# Patient Record
Sex: Female | Born: 2000 | Race: White | Hispanic: No | Marital: Single | Smoking: Never smoker
Health system: Southern US, Community
[De-identification: ages and names within clinical notes are randomized; demographics above are authoritative.]

## PROBLEM LIST (undated history)

## (undated) ENCOUNTER — Inpatient Hospital Stay (HOSPITAL_COMMUNITY): Payer: Self-pay

## (undated) DIAGNOSIS — Z8759 Personal history of other complications of pregnancy, childbirth and the puerperium: Secondary | ICD-10-CM

## (undated) DIAGNOSIS — O24419 Gestational diabetes mellitus in pregnancy, unspecified control: Secondary | ICD-10-CM

## (undated) DIAGNOSIS — Z789 Other specified health status: Secondary | ICD-10-CM

## (undated) HISTORY — PX: CHOLECYSTECTOMY: SHX55

## (undated) HISTORY — DX: Personal history of other complications of pregnancy, childbirth and the puerperium: Z87.59

---
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
## 2018-04-01 ENCOUNTER — Inpatient Hospital Stay (HOSPITAL_COMMUNITY)
Admission: AD | Admit: 2018-04-01 | Discharge: 2018-04-01 | Disposition: A | Discharge status: Home / Self Care | Payer: Medicaid Other | Attending: Obstetrics and Gynecology | Admitting: Obstetrics and Gynecology

## 2018-04-01 ENCOUNTER — Encounter (HOSPITAL_COMMUNITY): Payer: Self-pay | Admitting: *Deleted

## 2018-04-01 DIAGNOSIS — O26892 Other specified pregnancy related conditions, second trimester: Secondary | ICD-10-CM | POA: Diagnosis not present

## 2018-04-01 DIAGNOSIS — Z3A20 20 weeks gestation of pregnancy: Secondary | ICD-10-CM | POA: Diagnosis not present

## 2018-04-01 DIAGNOSIS — W19XXXA Unspecified fall, initial encounter: Secondary | ICD-10-CM | POA: Diagnosis not present

## 2018-04-01 DIAGNOSIS — W182XXA Fall in (into) shower or empty bathtub, initial encounter: Secondary | ICD-10-CM | POA: Insufficient documentation

## 2018-04-01 DIAGNOSIS — M25521 Pain in right elbow: Secondary | ICD-10-CM | POA: Diagnosis present

## 2018-04-01 DIAGNOSIS — Z3492 Encounter for supervision of normal pregnancy, unspecified, second trimester: Secondary | ICD-10-CM

## 2018-04-01 LAB — HEMOGLOBIN AND HEMATOCRIT, BLOOD
HEMATOCRIT: 38 % (ref 36.0–46.0)
HEMOGLOBIN: 13 g/dL (ref 12.0–15.0)

## 2018-04-01 LAB — URINALYSIS, ROUTINE W REFLEX MICROSCOPIC
Bilirubin Urine: NEGATIVE
Glucose, UA: NEGATIVE mg/dL
Hgb urine dipstick: NEGATIVE
KETONES UR: NEGATIVE mg/dL
LEUKOCYTES UA: NEGATIVE
Nitrite: NEGATIVE
PROTEIN: NEGATIVE mg/dL
Specific Gravity, Urine: >1.03 — ABNORMAL HIGH (ref 1.005–1.030)
pH: 5.5 (ref 5.0–8.0)

## 2018-04-01 MED ORDER — ACETAMINOPHEN 500 MG PO TABS
1000.0000 mg | ORAL_TABLET | Freq: Once | ORAL | Status: AC
  Administered 2018-04-01: 1000 mg via ORAL
  Filled 2018-04-01: qty 2

## 2018-04-01 NOTE — MAU Note (Signed)
Urine in lab 

## 2018-04-01 NOTE — MAU Provider Note (Signed)
History     CSN: [id number]  Arrival date and time: 04/01/18 1646   First Provider Initiated Contact with Patient 04/01/18 1717      Chief Complaint  Patient presents with  . right elbow pain   HPI Julia Nelson is a 17 y.o. G1P0 at [redacted]w[redacted]d who presents to MAU for evaluation after she fell in bathroom today at 1530pm. Patient states she has intermittent numbness in her right hand and foot which predates her fall. Today  "my leg gave out", causing her to fall out of the shower and onto her back, also bumping the back of her head. She denies abdominal trauma, leaking fluid, vaginal bleeding. She states her only complaint is her scraped right knee and her headache, which she rates as 3/10, located in right parietal, does not radiate, no aggravating or alleviating factors. She has not taken medication, iced her painful sites or tried other treatments.  OB History    Gravida  1   Para      Term      Preterm      AB      Living        SAB      TAB      Ectopic      Multiple      Live Births              Past Medical History:  Diagnosis Date  . Medical history non-contributory     Past Surgical History:  Procedure Laterality Date  . CHOLECYSTECTOMY  2016  . NO PAST SURGERIES      History reviewed. No pertinent family history.  Social History   Tobacco Use  . Smoking status: Never Smoker  . Smokeless tobacco: Never Used  Substance Use Topics  . Alcohol use: No    Frequency: Never  . Drug use: No    Allergies: No Known Allergies  Medications Prior to Admission  Medication Sig Dispense Refill Last Dose  . prenatal vitamin w/FE, FA (NATACHEW) 29-1 MG CHEW chewable tablet Chew 1 tablet by mouth daily at 12 noon.   03/31/2018 at Unknown time  . promethazine (PHENERGAN) 25 MG tablet Take 1 tablet (25 mg total) by mouth every 6 (six) hours as needed for nausea or vomiting. 30 tablet 0 03/31/2018 at Unknown time  . benzonatate (TESSALON) 100 MG capsule  Take 1 capsule (100 mg total) by mouth every 8 (eight) hours. 21 capsule 0 Unknown at Unknown time  . butalbital-acetaminophen-caffeine (FIORICET, ESGIC) 50-325-40 MG tablet Take 1-2 tablets by mouth every 6 (six) hours as needed for headache. 20 tablet 0 Unknown at Unknown time  . cyclobenzaprine (FLEXERIL) 5 MG tablet Take 1 tablet (5 mg total) by mouth 3 (three) times daily as needed for muscle spasms. 12 tablet 0 Unknown at Unknown time  . fluticasone (FLONASE) 50 MCG/ACT nasal spray Place 2 sprays into both nostrils daily. 1 g 0 Unknown at Unknown time    Review of Systems  Constitutional: Negative for chills, fatigue and fever.  Gastrointestinal: Negative for abdominal pain.  Genitourinary: Negative for vaginal bleeding, vaginal discharge and vaginal pain.  Musculoskeletal: Negative for back pain, gait problem, joint swelling and myalgias.  Skin: Positive for wound.       Abrasion to right knee  Neurological: Positive for headaches. Negative for dizziness, tremors, seizures, syncope, facial asymmetry, speech difficulty, weakness and light-headedness.   Physical Exam   Blood pressure 126/75, pulse (!) 106, temperature 98.6 F (37  C), temperature source Oral, resp. rate 16, height 5' 6.5" (1.689 m), weight 98 kg, last menstrual period 11/07/2017, SpO2 100 %.  Physical Exam  Nursing note and vitals reviewed. Constitutional: She is oriented to person, place, and time. She appears well-developed and well-nourished.  Cardiovascular: Normal rate, normal heart sounds and intact distal pulses.  Respiratory: Effort normal and breath sounds normal.  GI: Soft. She exhibits no distension. There is no abdominal tenderness. There is no rebound and no guarding.  Neurological: She is alert and oriented to person, place, and time. She has normal strength and normal reflexes. She displays no tremor and normal reflexes. No cranial nerve deficit or sensory deficit. She exhibits normal muscle tone.  Coordination and gait normal.  Reflex Scores:      Brachioradialis reflexes are 2+ on the right side and 2+ on the left side.      Achilles reflexes are 2+ on the right side and 2+ on the left side. Skin: Skin is warm and dry.  Supervision 1.5cm abrasion to right knee  Psychiatric: She has a normal mood and affect. Her behavior is normal. Judgment and thought content normal.     MAU Course/MDM   --Intact neuro exam, no evidence of head trauma --Headache resolved with Tylenol administered in MAU --No external injury from fall other than scrape to right knee. No bruising or swelling noted anywhere --No tenderness induced during scalp exam. No swelling or lump palpated --Reviewed concussion signs and symptoms. Patient lives with her FOB and he will be home to monitor her   Patient Vitals for the past 24 hrs:  BP Temp Temp src Pulse Resp SpO2 Height Weight  04/01/18 1934 128/71 - - - - - - -  04/01/18 1658 126/75 98.6 F (37 C) Oral (!) 106 16 100 % 5' 6.5" (1.689 m) 98 kg    Results for orders placed or performed during the hospital encounter of 04/01/18 (from the past 24 hour(s))  Urinalysis, Routine w reflex microscopic     Status: Abnormal   Collection Time: 04/01/18  5:07 PM  Result Value Ref Range   Color, Urine YELLOW YELLOW   APPearance HAZY (A) CLEAR   Specific Gravity, Urine >1.030 (H) 1.005 - 1.030   pH 5.5 5.0 - 8.0   Glucose, UA NEGATIVE NEGATIVE mg/dL   Hgb urine dipstick NEGATIVE NEGATIVE   Bilirubin Urine NEGATIVE NEGATIVE   Ketones, ur NEGATIVE NEGATIVE mg/dL   Protein, ur NEGATIVE NEGATIVE mg/dL   Nitrite NEGATIVE NEGATIVE   Leukocytes, UA NEGATIVE NEGATIVE  Hemoglobin and hematocrit, blood     Status: None   Collection Time: 04/01/18  6:52 PM  Result Value Ref Range   Hemoglobin 13.0 12.0 - 15.0 g/dL   HCT 16.138.0 09.636.0 - 04.546.0 %    Assessment and Plan  --17 y.o. G1P0 at 7013w5d  --Initial encounter, fall without abdominal trauma --FHT 162 by doppler --Return  to closest ED for new onset signs/symptoms associated with increasing acuity (see AVS) --Discharge home in stable condition  F/U: Patient has OB appt mid-January  Calvert CantorSamantha C Brittin Belnap, CNM 04/01/2018, 7:52 PM

## 2018-04-01 NOTE — Discharge Instructions (Signed)
Concussion, Adult  A concussion is a brain injury from a direct hit (blow) to the head or body. This injury causes the brain to shake quickly back and forth inside the skull. It is caused by:   A hit to the head.   A quick and sudden movement (jolt) of the head or neck.    How fast you will get better from a concussion depends on many things like how bad your concussion was, what part of your brain was hurt, how old you are, and how healthy you were before the concussion. Recovery can take time. It is important to wait to return to activity until a doctor says it is safe and your symptoms are all gone.  Follow these instructions at home:  Activity   Limit activities that need a lot of thought or concentration. These include:  ? Homework or work for your job.  ? Watching TV.  ? Computer work.  ? Playing memory games and puzzles.   Rest. Rest helps the brain to heal. Make sure you:  ? Get plenty of sleep at night. Do not stay up late.  ? Go to bed at the same time every day.  ? Rest during the day. Take naps or rest breaks when you feel tired.   It can be dangerous if you get another concussion before the first one has healed Do not do activities that could cause a second concussion, such as riding a bike or playing sports.   Ask your doctor when you can return to your normal activities, like driving, riding a bike, or using machinery. Your ability to react may be slower. Do not do these activities if you are dizzy. Your doctor will likely give you a plan for slowly going back to activities.  General instructions   Take over-the-counter and prescription medicines only as told by your doctor.   Do not drink alcohol until your doctor says you can.   If it is harder than usual to remember things, write them down.   If you are easily distracted, try to do one thing at a time. For example, do not try to watch TV while making dinner.   Talk with family members or close friends when you need to make important  decisions.   Watch your symptoms and tell other people to do the same. Other problems (complications) can happen after a concussion. Older adults with a brain injury may have a higher risk of serious problems, such as a blood clot in the brain.   Tell your teachers, school nurse, school counselor, coach, athletic trainer, or work manager about your injury and symptoms. Tell them about what you can or cannot do. They should watch for:  ? More problems with attention or concentration.  ? More trouble remembering or learning new information.  ? More time needed to do tasks or assignments.  ? Being more annoyed (irritable) or having a harder time dealing with stress.  ? Any other symptoms that get worse.   Keep all follow-up visits as told by your health care provider. This is important.  Prevention   It is very important that you donot get another brain injury, especially before you have healed. In rare cases, another injury can cause permanent brain damage, brain swelling, or death. You have the most risk if you get another head injury in the first 7-10 days after you were hurt before. To avoid injuries:  ? Wear a seat belt when you ride in   a car.  ? Do not drink too much alcohol.  ? Avoid activities that could make you get a second concussion, like contact sports.  ? Wear a helmet when you do activities like:   Biking.   Skiing.   Skateboarding.   Skating.  ? Make your home safe by:   Removing things from the floor or stairs that could make you trip.   Using grab bars in bathrooms and handrails by stairs.   Placing non-slip mats on floors and in bathtubs.   Putting more light in dark areas.  Contact a doctor if:   Your symptoms get worse.   You have new symptoms.   You keep having symptoms for more than 2 weeks.  Get help right away if:   You have bad headaches, or your headaches get worse.   You have weakness in any part of your body.   You have loss of feeling (numbness).   You feel off  balance.   You keep throwing up (vomiting).   You feel more sleepy.   The black center of one eye (pupil) is bigger than the other one.   You twitch or shake violently (convulse) or have a seizure.   Your speech is not clear (is slurred).   You feel more tired, more confused, or more annoyed.   You do not recognize people or places.   You have neck pain.   It is hard to wake you up.   You have strange behavior changes.   You pass out (lose consciousness).  Summary   A concussion is a brain injury from a direct hit (blow) to the head or body.   This condition is treated with rest and careful watching of symptoms.   If you keep having symptoms for more than 2 weeks, call your doctor.  This information is not intended to replace advice given to you by your health care provider. Make sure you discuss any questions you have with your health care provider.  Document Released: 03/22/2009 Document Revised: 03/18/2016 Document Reviewed: 03/18/2016  [employer]vier Interactive Patient Education  2017 [employer]

## 2018-04-01 NOTE — MAU Note (Signed)
Pt reports that her right leg has been numb off/on since this am. Reports when she was in the shower her leg "gave out" and she fell . This happened at 1530, denies bleeding. Reports she landed on her back and right arm and her head hit the floor.

## 2018-04-17 DIAGNOSIS — I10 Essential (primary) hypertension: Secondary | ICD-10-CM

## 2018-04-17 HISTORY — DX: Essential (primary) hypertension: I10

## 2018-04-17 NOTE — L&D Delivery Note (Signed)
Pt was admitted for induction yesterday. She received 3 cytotecs then had pit aug. She had an amniotomy with clear fluid. She completed the first stage without difficulty. She had an epidural. She pushed for one hour.She had a SVD of one live viable female infant over an intact perineum in the ROA position. Placenta S/I. EBL-450cc/ Baby to NBN. Bilateral vaginal wall tears closed with 3-0 chromic.

## 2018-06-23 ENCOUNTER — Inpatient Hospital Stay (HOSPITAL_COMMUNITY)
Admission: AD | Admit: 2018-06-23 | Discharge: 2018-06-23 | Disposition: A | Discharge status: Home / Self Care | Payer: Medicaid Other | Attending: Obstetrics and Gynecology | Admitting: Obstetrics and Gynecology

## 2018-06-23 ENCOUNTER — Other Ambulatory Visit: Payer: Self-pay

## 2018-06-23 ENCOUNTER — Encounter (HOSPITAL_COMMUNITY): Payer: Self-pay

## 2018-06-23 DIAGNOSIS — Z0371 Encounter for suspected problem with amniotic cavity and membrane ruled out: Secondary | ICD-10-CM | POA: Diagnosis present

## 2018-06-23 DIAGNOSIS — Z9049 Acquired absence of other specified parts of digestive tract: Secondary | ICD-10-CM | POA: Diagnosis not present

## 2018-06-23 DIAGNOSIS — Z833 Family history of diabetes mellitus: Secondary | ICD-10-CM | POA: Insufficient documentation

## 2018-06-23 DIAGNOSIS — O4703 False labor before 37 completed weeks of gestation, third trimester: Secondary | ICD-10-CM

## 2018-06-23 DIAGNOSIS — O479 False labor, unspecified: Secondary | ICD-10-CM | POA: Diagnosis present

## 2018-06-23 DIAGNOSIS — Z3A32 32 weeks gestation of pregnancy: Secondary | ICD-10-CM | POA: Diagnosis not present

## 2018-06-23 DIAGNOSIS — O4700 False labor before 37 completed weeks of gestation, unspecified trimester: Secondary | ICD-10-CM | POA: Diagnosis present

## 2018-06-23 LAB — WET PREP, GENITAL
CLUE CELLS WET PREP: NONE SEEN
Sperm: NONE SEEN
TRICH WET PREP: NONE SEEN
YEAST WET PREP: NONE SEEN

## 2018-06-23 LAB — URINALYSIS, ROUTINE W REFLEX MICROSCOPIC
Bilirubin Urine: NEGATIVE
Glucose, UA: NEGATIVE mg/dL
Hgb urine dipstick: NEGATIVE
KETONES UR: 20 mg/dL — AB
LEUKOCYTE UA: NEGATIVE
NITRITE: NEGATIVE
PROTEIN: NEGATIVE mg/dL
Specific Gravity, Urine: 1.004 — ABNORMAL LOW (ref 1.005–1.030)
pH: 7 (ref 5.0–8.0)

## 2018-06-23 MED ORDER — NIFEDIPINE 10 MG PO CAPS
10.0000 mg | ORAL_CAPSULE | ORAL | Status: DC
  Administered 2018-06-23: 10 mg via ORAL
  Filled 2018-06-23: qty 1

## 2018-06-23 MED ORDER — CYCLOBENZAPRINE HCL 10 MG PO TABS
10.0000 mg | ORAL_TABLET | Freq: Once | ORAL | Status: AC
  Administered 2018-06-23: 10 mg via ORAL
  Filled 2018-06-23: qty 1

## 2018-06-23 NOTE — MAU Provider Note (Signed)
History     CSN: [id number]  Arrival date and time: 06/23/18 2005   First Provider Initiated Contact with Patient 06/23/18 2056      Chief Complaint  Patient presents with  . Rupture of Membranes  . Abdominal Pain   HPI  Ms.  Julia Nelson is a 18 y.o. year old G1P0 female at [redacted]w[redacted]d weeks gestation who presents to MAU reporting leaking a watery vaginal d/c since 0700 on 06/22/2018, lower abdomen and back pain x 2 wks. She has not taken any medications for the pain nor has she mentioned it to her OB in the 2 wks that it has been occurring. Last SI was 2 wks ago. She denies VB. She reports good (+) FM.   Past Medical History:  Diagnosis Date  . Medical history non-contributory     Past Surgical History:  Procedure Laterality Date  . CHOLECYSTECTOMY  2016  . NO PAST SURGERIES      Family History  Problem Relation Age of Onset  . Diabetes Mother        Type II    Social History   Tobacco Use  . Smoking status: Never Smoker  . Smokeless tobacco: Never Used  Substance Use Topics  . Alcohol use: No    Frequency: Never  . Drug use: No    Allergies: No Known Allergies  Medications Prior to Admission  Medication Sig Dispense Refill Last Dose  . benzonatate (TESSALON) 100 MG capsule Take 1 capsule (100 mg total) by mouth every 8 (eight) hours. 21 capsule 0 Unknown at Unknown time  . butalbital-acetaminophen-caffeine (FIORICET, ESGIC) 50-325-40 MG tablet Take 1-2 tablets by mouth every 6 (six) hours as needed for headache. 20 tablet 0 Unknown at Unknown time  . cyclobenzaprine (FLEXERIL) 5 MG tablet Take 1 tablet (5 mg total) by mouth 3 (three) times daily as needed for muscle spasms. 12 tablet 0 Unknown at Unknown time  . fluticasone (FLONASE) 50 MCG/ACT nasal spray Place 2 sprays into both nostrils daily. 1 g 0 Unknown at Unknown time  . prenatal vitamin w/FE, FA (NATACHEW) 29-1 MG CHEW chewable tablet Chew 1 tablet by mouth daily at 12 noon.   03/31/2018 at Unknown time   . promethazine (PHENERGAN) 25 MG tablet Take 1 tablet (25 mg total) by mouth every 6 (six) hours as needed for nausea or vomiting. 30 tablet 0 03/31/2018 at Unknown time    Review of Systems  Constitutional: Negative.   HENT: Negative.   Eyes: Negative.   Respiratory: Negative.   Cardiovascular: Negative.   Gastrointestinal: Negative.   Endocrine: Negative.   Genitourinary: Positive for pelvic pain (cramping) and vaginal discharge ("my panties keep getting wet and I know I'm not peeing on myself."). Negative for vaginal bleeding.  Musculoskeletal: Positive for back pain (lower).  Skin: Negative.   Allergic/Immunologic: Negative.   Neurological: Negative.   Hematological: Negative.   Psychiatric/Behavioral: Negative.    Physical Exam   Blood pressure 122/86, pulse (!) 120, temperature 98.5 F (36.9 C), resp. rate 18, last menstrual period 11/07/2017, SpO2 98 %.  Physical Exam  Nursing note and vitals reviewed. Constitutional: She is oriented to person, place, and time. She appears well-developed and well-nourished.  HENT:  Head: Normocephalic and atraumatic.  Eyes: Pupils are equal, round, and reactive to light.  Neck: Normal range of motion.  Cardiovascular: Normal rate.  Respiratory: Effort normal.  GI: Soft.  Genitourinary:    Genitourinary Comments: Uterus: gravid, S=D, SE: cervix is smooth, pink,  no lesions, small amt of thin, white, clumpy vaginal d/c -- WP done, closed/long/firm, no CMT or friability, no adnexal tenderness    Musculoskeletal: Normal range of motion.  Neurological: She is alert and oriented to person, place, and time.  Skin: Skin is warm and dry.  Psychiatric: She has a normal mood and affect. Her behavior is normal. Judgment and thought content normal.   Cervical recheck @ 2200 - unchanged  MAU Course  Procedures  MDM [facility] Wet Prep Flexeril 10 mg po -- "the pain has gone down" Procardia 10 mg every 20 mins x 3 doses -- stopped after 1 dose,  d/c'd 2nd and 3rd doses NST - FHR: 140 bpm / moderate variability / accels present / decels absent / TOCO: Occ. UC with UI noted  Results for orders placed or performed during the hospital encounter of 06/23/18 (from the past 24 hour(s))  Urinalysis, Routine w reflex microscopic     Status: Abnormal   Collection Time: 06/23/18  8:34 PM  Result Value Ref Range   Color, Urine YELLOW YELLOW   APPearance HAZY (A) CLEAR   Specific Gravity, Urine 1.004 (L) 1.005 - 1.030   pH 7.0 5.0 - 8.0   Glucose, UA NEGATIVE NEGATIVE mg/dL   Hgb urine dipstick NEGATIVE NEGATIVE   Bilirubin Urine NEGATIVE NEGATIVE   Ketones, ur 20 (A) NEGATIVE mg/dL   Protein, ur NEGATIVE NEGATIVE mg/dL   Nitrite NEGATIVE NEGATIVE   Leukocytes,Ua NEGATIVE NEGATIVE  Wet prep, genital     Status: Abnormal   Collection Time: 06/23/18  9:08 PM  Result Value Ref Range   Yeast Wet Prep HPF POC NONE SEEN NONE SEEN   Trich, Wet Prep NONE SEEN NONE SEEN   Clue Cells Wet Prep HPF POC NONE SEEN NONE SEEN   WBC, Wet Prep HPF POC MANY (A) NONE SEEN   Sperm NONE SEEN     Assessment and Plan  No leakage of amniotic fluid into vagina - Reassurance given that the vaginal discharge seen on exam is normal     Preterm contractions  - Advised to call OB for >6 painful contractions in 1 hr - Information provided on PTL & preventing PTB - Advised to take Tylenol 1000 mg every 6 hrs prn or Flexeril (that she has at home)   - Discharge patient - Keep scheduled appt with [facility] on 06/28/2018 - Patient verbalized an understanding of the plan of care and agrees.   Julia Mora, MSN, CNM 06/23/2018, 8:56 PM

## 2018-06-23 NOTE — MAU Note (Addendum)
Presents with c/o watery leakage around 7:00a yesterday. C/o lower abd and back pain 7/10 x 2wks. No meds taken. No vag bldg. Pos FM. Denies h/o MRSA HSV.   Adah Perl RN

## 2018-06-23 NOTE — Discharge Instructions (Signed)
The test on your vaginal discharge is negative for any type of infection. After careful examination of the discharge, it is apparent that your water has NOT broken at this time. Please wear a pantiliner, chart carefully how many pantiliners you have to change and in what amount of timeframe passes that you have to change them.  Your preterm contractions stopped prior to you leaving today. If you have more than 6 painful contractions in 1 hour, call your doctor's office with the expectation that they will have you return here for evaluation.

## 2018-06-25 ENCOUNTER — Encounter (HOSPITAL_COMMUNITY): Payer: Self-pay

## 2018-06-25 ENCOUNTER — Inpatient Hospital Stay (HOSPITAL_COMMUNITY)
Admission: AD | Admit: 2018-06-25 | Discharge: 2018-06-26 | Disposition: A | Discharge status: Home / Self Care | Payer: Medicaid Other | Source: Ambulatory Visit | Attending: Obstetrics and Gynecology | Admitting: Obstetrics and Gynecology

## 2018-06-25 DIAGNOSIS — Z3A32 32 weeks gestation of pregnancy: Secondary | ICD-10-CM | POA: Diagnosis not present

## 2018-06-25 DIAGNOSIS — O212 Late vomiting of pregnancy: Secondary | ICD-10-CM | POA: Insufficient documentation

## 2018-06-25 DIAGNOSIS — O4703 False labor before 37 completed weeks of gestation, third trimester: Secondary | ICD-10-CM | POA: Diagnosis not present

## 2018-06-25 DIAGNOSIS — O4700 False labor before 37 completed weeks of gestation, unspecified trimester: Secondary | ICD-10-CM

## 2018-06-25 DIAGNOSIS — Z3A33 33 weeks gestation of pregnancy: Secondary | ICD-10-CM | POA: Diagnosis not present

## 2018-06-25 DIAGNOSIS — R102 Pelvic and perineal pain: Secondary | ICD-10-CM | POA: Diagnosis not present

## 2018-06-25 DIAGNOSIS — Z0371 Encounter for suspected problem with amniotic cavity and membrane ruled out: Secondary | ICD-10-CM | POA: Diagnosis not present

## 2018-06-25 DIAGNOSIS — O479 False labor, unspecified: Secondary | ICD-10-CM

## 2018-06-25 DIAGNOSIS — O26893 Other specified pregnancy related conditions, third trimester: Secondary | ICD-10-CM | POA: Insufficient documentation

## 2018-06-25 LAB — URINALYSIS, ROUTINE W REFLEX MICROSCOPIC
Bilirubin Urine: NEGATIVE
Glucose, UA: NEGATIVE mg/dL
Hgb urine dipstick: NEGATIVE
KETONES UR: NEGATIVE mg/dL
LEUKOCYTE UA: NEGATIVE
NITRITE: NEGATIVE
PROTEIN: NEGATIVE mg/dL
Specific Gravity, Urine: 1.005 (ref 1.005–1.030)
pH: 7 (ref 5.0–8.0)

## 2018-06-25 MED ORDER — LACTATED RINGERS IV BOLUS
1000.0000 mL | Freq: Once | INTRAVENOUS | Status: AC
  Administered 2018-06-25: 1000 mL via INTRAVENOUS

## 2018-06-25 MED ORDER — NIFEDIPINE 10 MG PO CAPS
10.0000 mg | ORAL_CAPSULE | ORAL | Status: AC
  Administered 2018-06-25 – 2018-06-26 (×3): 10 mg via ORAL
  Filled 2018-06-25 (×3): qty 1

## 2018-06-25 NOTE — MAU Provider Note (Signed)
History     CSN: [id number]  Arrival date and time: 06/25/18 2216   First Provider Initiated Contact with Patient 06/25/18 2302      Chief Complaint  Patient presents with  . Contractions   Julia Nelson is a 18 y.o. G1P0 at [redacted]w[redacted]d who presents today with contractions that started around 0900 today. She denies any VB or LOF. She reports normal fetal movement. Next OB visit is 06/28/18. Patient was seen here on 06/23/18 and was having contractions. They did stop with procardia, and her cervix was closed at that visit. Wet prep negative on 06/23/18. No GC/CT done.   Pelvic Pain  The patient's primary symptoms include pelvic pain. The patient's pertinent negatives include no vaginal discharge. This is a new problem. The current episode started today. The problem occurs intermittently. The problem has been unchanged. Pain severity now: 8/10. The problem affects both sides. She is pregnant. Associated symptoms include nausea and vomiting. Pertinent negatives include no chills, dysuria, fever or frequency. The vaginal discharge was normal. There has been no bleeding. Nothing aggravates the symptoms. She has tried nothing for the symptoms. Sexual activity: denies intercourse in the last 24 hours.     OB History    Gravida  1   Para      Term      Preterm      AB      Living        SAB      TAB      Ectopic      Multiple      Live Births              Past Medical History:  Diagnosis Date  . Medical history non-contributory     Past Surgical History:  Procedure Laterality Date  . CHOLECYSTECTOMY  2016  . CHOLECYSTECTOMY      Family History  Problem Relation Age of Onset  . Diabetes Mother        Type II    Social History   Tobacco Use  . Smoking status: Never Smoker  . Smokeless tobacco: Never Used  Substance Use Topics  . Alcohol use: No    Frequency: Never  . Drug use: No    Allergies: No Known Allergies  Medications Prior to Admission  Medication  Sig Dispense Refill Last Dose  . acetaminophen (TYLENOL) 500 MG tablet Take 1,000 mg by mouth every 6 (six) hours as needed.   06/24/2018 at Unknown time  . cyclobenzaprine (FLEXERIL) 5 MG tablet Take 1 tablet (5 mg total) by mouth 3 (three) times daily as needed for muscle spasms. 12 tablet 0 Past Week at Unknown time  . prenatal vitamin w/FE, FA (NATACHEW) 29-1 MG CHEW chewable tablet Chew 1 tablet by mouth daily at 12 noon.   06/24/2018 at Unknown time  . promethazine (PHENERGAN) 25 MG tablet Take 1 tablet (25 mg total) by mouth every 6 (six) hours as needed for nausea or vomiting. 30 tablet 0 06/24/2018 at Unknown time  . butalbital-acetaminophen-caffeine (FIORICET, ESGIC) 50-325-40 MG tablet Take 1-2 tablets by mouth every 6 (six) hours as needed for headache. 20 tablet 0 Unknown at Unknown time  . fluticasone (FLONASE) 50 MCG/ACT nasal spray Place 2 sprays into both nostrils daily. 1 g 0 More than a month at Unknown time    Review of Systems  Constitutional: Negative for chills and fever.  Gastrointestinal: Positive for nausea and vomiting.  Genitourinary: Positive for pelvic pain. Negative for  dysuria, frequency, vaginal bleeding and vaginal discharge.   Physical Exam   Blood pressure 138/82, pulse (!) 111, temperature 99.1 F (37.3 C), temperature source Oral, resp. rate 18, height 5\' 7"  (1.702 m), weight 105.8 kg, last menstrual period 11/07/2017.  Physical Exam  Nursing note and vitals reviewed. Constitutional: She is oriented to person, place, and time. She appears well-developed and well-nourished. No distress.  HENT:  Head: Normocephalic.  Cardiovascular: Normal rate.  Respiratory: Effort normal.  GI: Soft. There is no abdominal tenderness. There is no rebound.  Genitourinary:    Genitourinary Comments:  Dilation: Closed Effacement (%): Thick Station: -2 Exam by:: Thressa Sheller CNM    Neurological: She is alert and oriented to person, place, and time.  Skin: Skin is warm and  dry.  Psychiatric: She has a normal mood and affect.   NST:  Baseline: 140 Variability: moderate Accels: 15x15 accels Decels: no decels Toco: q 2-3 mins   Results for orders placed or performed during the hospital encounter of 06/25/18 (from the past 24 hour(s))  Urinalysis, Routine w reflex microscopic     Status: Abnormal   Collection Time: 06/25/18 10:39 PM  Result Value Ref Range   Color, Urine STRAW (A) YELLOW   APPearance CLEAR CLEAR   Specific Gravity, Urine 1.005 1.005 - 1.030   pH 7.0 5.0 - 8.0   Glucose, UA NEGATIVE NEGATIVE mg/dL   Hgb urine dipstick NEGATIVE NEGATIVE   Bilirubin Urine NEGATIVE NEGATIVE   Ketones, ur NEGATIVE NEGATIVE mg/dL   Protein, ur NEGATIVE NEGATIVE mg/dL   Nitrite NEGATIVE NEGATIVE   Leukocytes,Ua NEGATIVE NEGATIVE  Fetal fibronectin     Status: None   Collection Time: 06/26/18 12:35 AM  Result Value Ref Range   Fetal Fibronectin NEGATIVE NEGATIVE    MAU Course  Procedures  MDM Patient given 1L bolus and 3 doses of procardia.   Contractions still about every 2-3 mins with interventions. FFN negative.  Repeat cervical exam:  Dilation: Closed Effacement (%): Thick Station: -3 Exam by:: Thressa Sheller CNM  Assessment and Plan   1. Threatened premature labor in third trimester   2. No leakage of amniotic fluid into vagina   3. Preterm contractions   4. [redacted] weeks gestation of pregnancy    DC home Comfort measures reviewed  3rd Trimester precautions  PTL precautions  Fetal kick counts RX: procardia 10mg  q4 hours PRN #30  Return to MAU as needed FU with OB as planned   Follow-up Information    Ob/Gyn, [facility] Follow up.   Contact information: [address] [place] [address] [phone]

## 2018-06-25 NOTE — MAU Note (Signed)
PT SAYS SHE WAS HERE ON Sunday NIGHT FOR UC-  STOPPED UC'S.- VE - CLOSED   . THEN D/C HOME.   THEN UC STARTED AGAIN-  TODAY AT 0900.-   HAS BEEN UC ALL DAY. [facility] - WITH  DR Chestine Spore-

## 2018-06-26 LAB — GC/CHLAMYDIA PROBE AMP (~~LOC~~) NOT AT ARMC
Chlamydia: NEGATIVE
NEISSERIA GONORRHEA: NEGATIVE

## 2018-06-26 LAB — FETAL FIBRONECTIN: Fetal Fibronectin: NEGATIVE

## 2018-06-26 MED ORDER — TERBUTALINE SULFATE 1 MG/ML IJ SOLN
0.2500 mg | Freq: Once | INTRAMUSCULAR | Status: AC
  Administered 2018-06-26: 0.25 mg via SUBCUTANEOUS
  Filled 2018-06-26: qty 1

## 2018-06-26 MED ORDER — CYCLOBENZAPRINE HCL 10 MG PO TABS
10.0000 mg | ORAL_TABLET | Freq: Once | ORAL | Status: AC
  Administered 2018-06-26: 10 mg via ORAL
  Filled 2018-06-26: qty 1

## 2018-06-26 MED ORDER — NIFEDIPINE 10 MG PO CAPS: 10.0000 mg | ORAL_CAPSULE | ORAL | 1 refills | Status: DC | PRN

## 2018-06-26 NOTE — Discharge Instructions (Signed)

## 2018-07-01 ENCOUNTER — Telehealth: Payer: Self-pay | Admitting: Pediatrics

## 2018-07-01 ENCOUNTER — Institutional Professional Consult (permissible substitution): Payer: Self-pay | Admitting: Pediatrics

## 2018-07-01 NOTE — Telephone Encounter (Signed)
Prenatal counseling for impending newborn done--1st child, currently 32-33wks, no complications, prenatal started 9wks Z76.81 

## 2018-07-02 ENCOUNTER — Other Ambulatory Visit: Payer: Self-pay

## 2018-07-02 ENCOUNTER — Ambulatory Visit (INDEPENDENT_AMBULATORY_CARE_PROVIDER_SITE_OTHER): Payer: Self-pay | Admitting: Pediatrics

## 2018-07-02 DIAGNOSIS — Z7681 Expectant parent(s) prebirth pediatrician visit: Secondary | ICD-10-CM

## 2018-07-02 NOTE — Progress Notes (Signed)
Prenatal counseling for impending newborn done--1st child, currently 32-33wks, no complications, prenatal started 9wks Z16.81

## 2018-07-03 ENCOUNTER — Other Ambulatory Visit: Payer: Self-pay

## 2018-07-03 ENCOUNTER — Inpatient Hospital Stay (HOSPITAL_COMMUNITY)
Admission: AD | Admit: 2018-07-03 | Discharge: 2018-07-03 | Disposition: A | Discharge status: Home / Self Care | Payer: Medicaid Other | Attending: Obstetrics and Gynecology | Admitting: Obstetrics and Gynecology

## 2018-07-03 ENCOUNTER — Encounter (HOSPITAL_COMMUNITY): Payer: Self-pay

## 2018-07-03 DIAGNOSIS — Z3A34 34 weeks gestation of pregnancy: Secondary | ICD-10-CM

## 2018-07-03 DIAGNOSIS — Z79899 Other long term (current) drug therapy: Secondary | ICD-10-CM | POA: Insufficient documentation

## 2018-07-03 DIAGNOSIS — O479 False labor, unspecified: Secondary | ICD-10-CM | POA: Diagnosis present

## 2018-07-03 DIAGNOSIS — O4700 False labor before 37 completed weeks of gestation, unspecified trimester: Secondary | ICD-10-CM

## 2018-07-03 DIAGNOSIS — R102 Pelvic and perineal pain: Secondary | ICD-10-CM | POA: Diagnosis not present

## 2018-07-03 DIAGNOSIS — O26893 Other specified pregnancy related conditions, third trimester: Secondary | ICD-10-CM | POA: Diagnosis not present

## 2018-07-03 DIAGNOSIS — Z0371 Encounter for suspected problem with amniotic cavity and membrane ruled out: Secondary | ICD-10-CM

## 2018-07-03 LAB — URINALYSIS, ROUTINE W REFLEX MICROSCOPIC
Bilirubin Urine: NEGATIVE
Glucose, UA: NEGATIVE mg/dL
Hgb urine dipstick: NEGATIVE
Ketones, ur: NEGATIVE mg/dL
Nitrite: NEGATIVE
Protein, ur: 30 mg/dL — AB
Specific Gravity, Urine: 1.014 (ref 1.005–1.030)
pH: 7 (ref 5.0–8.0)

## 2018-07-03 NOTE — Discharge Instructions (Signed)
° °Braxton Hicks Contractions °Contractions of the uterus can occur throughout pregnancy, but they are not always a sign that you are in labor. You may have practice contractions called Braxton Hicks contractions. These false labor contractions are sometimes confused with true labor. °What are Braxton Hicks contractions? °Braxton Hicks contractions are tightening movements that occur in the muscles of the uterus before labor. Unlike true labor contractions, these contractions do not result in opening (dilation) and thinning of the cervix. Toward the end of pregnancy (32-34 weeks), Braxton Hicks contractions can happen more often and may become stronger. These contractions are sometimes difficult to tell apart from true labor because they can be very uncomfortable. You should not feel embarrassed if you go to the hospital with false labor. °Sometimes, the only way to tell if you are in true labor is for your health care provider to look for changes in the cervix. The health care provider will do a physical exam and may monitor your contractions. If you are not in true labor, the exam should show that your cervix is not dilating and your water has not broken. °If there are no other health problems associated with your pregnancy, it is completely safe for you to be sent home with false labor. You may continue to have Braxton Hicks contractions until you go into true labor. °How to tell the difference between true labor and false labor °True labor °· Contractions last 30-70 seconds. °· Contractions become very regular. °· Discomfort is usually felt in the top of the uterus, and it spreads to the lower abdomen and low back. °· Contractions do not go away with walking. °· Contractions usually become more intense and increase in frequency. °· The cervix dilates and gets thinner. °False labor °· Contractions are usually shorter and not as strong as true labor contractions. °· Contractions are usually  irregular. °· Contractions are often felt in the front of the lower abdomen and in the groin. °· Contractions may go away when you walk around or change positions while lying down. °· Contractions get weaker and are shorter-lasting as time goes on. °· The cervix usually does not dilate or become thin. °Follow these instructions at home: ° °· Take over-the-counter and prescription medicines only as told by your health care provider. °· Keep up with your usual exercises and follow other instructions from your health care provider. °· Eat and drink lightly if you think you are going into labor. °· If Braxton Hicks contractions are making you uncomfortable: °? Change your position from lying down or resting to walking, or change from walking to resting. °? Sit and rest in a tub of warm water. °? Drink enough fluid to keep your urine pale yellow. Dehydration may cause these contractions. °? Do slow and deep breathing several times an hour. °· Keep all follow-up prenatal visits as told by your health care provider. This is important. °Contact a health care provider if: °· You have a fever. °· You have continuous pain in your abdomen. °Get help right away if: °· Your contractions become stronger, more regular, and closer together. °· You have fluid leaking or gushing from your vagina. °· You pass blood-tinged mucus (bloody show). °· You have bleeding from your vagina. °· You have low back pain that you never had before. °· You feel your baby’s head pushing down and causing pelvic pressure. °· Your baby is not moving inside you as much as it used to. °Summary °· Contractions that occur before labor   are called Braxton Hicks contractions, false labor, or practice contractions. °· Braxton Hicks contractions are usually shorter, weaker, farther apart, and less regular than true labor contractions. True labor contractions usually become progressively stronger and regular, and they become more frequent. °· Manage discomfort from  Braxton Hicks contractions by changing position, resting in a warm bath, drinking plenty of water, or practicing deep breathing. °This information is not intended to replace advice given to you by your health care provider. Make sure you discuss any questions you have with your health care provider. °Document Released: 08/17/2016 Document Revised: 01/16/2017 Document Reviewed: 08/17/2016 °Elsevier Interactive Patient Education © 2019 [employer] ° ° °Preterm Labor and Birth Information ° °The normal length of a pregnancy is 39-41 weeks. Preterm labor is when labor starts before 37 completed weeks of pregnancy. °What are the risk factors for preterm labor? °Preterm labor is more likely to occur in women who: °· Have certain infections during pregnancy such as a bladder infection, sexually transmitted infection, or infection inside the uterus (chorioamnionitis). °· Have a shorter-than-normal cervix. °· Have gone into preterm labor before. °· Have had surgery on their cervix. °· Are younger than age 17 or older than age 35. °· Are African American. °· Are pregnant with twins or multiple babies (multiple gestation). °· Take street drugs or smoke while pregnant. °· Do not gain enough weight while pregnant. °· Became pregnant shortly after having been pregnant. °What are the symptoms of preterm labor? °Symptoms of preterm labor include: °· Cramps similar to those that can happen during a menstrual period. The cramps may happen with diarrhea. °· Pain in the abdomen or lower back. °· Regular uterine contractions that may feel like tightening of the abdomen. °· A feeling of increased pressure in the pelvis. °· Increased watery or bloody mucus discharge from the vagina. °· Water breaking (ruptured amniotic sac). °Why is it important to recognize signs of preterm labor? °It is important to recognize signs of preterm labor because babies who are born prematurely may not be fully developed. This can put them at an increased  risk for: °· Long-term (chronic) heart and lung problems. °· Difficulty immediately after birth with regulating body systems, including blood sugar, body temperature, heart rate, and breathing rate. °· Bleeding in the brain. °· Cerebral palsy. °· Learning difficulties. °· Death. °These risks are highest for babies who are born before 34 weeks of pregnancy. °How is preterm labor treated? °Treatment depends on the length of your pregnancy, your condition, and the health of your baby. It may involve: °· Having a stitch (suture) placed in your cervix to prevent your cervix from opening too early (cerclage). °· Taking or being given medicines, such as: °? Hormone medicines. These may be given early in pregnancy to help support the pregnancy. °? Medicine to stop contractions. °? Medicines to help mature the baby’s lungs. These may be prescribed if the risk of delivery is high. °? Medicines to prevent your baby from developing cerebral palsy. °If the labor happens before 34 weeks of pregnancy, you may need to stay in the hospital. °What should I do if I think I am in preterm labor? °If you think that you are going into preterm labor, call your health care provider right away. °How can I prevent preterm labor in future pregnancies? °To increase your chance of having a full-term pregnancy: °· Do not use any tobacco products, such as cigarettes, chewing tobacco, and e-cigarettes. If you need help quitting, ask your health care   provider. °· Do not use street drugs or medicines that have not been prescribed to you during your pregnancy. °· Talk with your health care provider before taking any herbal supplements, even if you have been taking them regularly. °· Make sure you gain a healthy amount of weight during your pregnancy. °· Watch for infection. If you think that you might have an infection, get it checked right away. °· Make sure to tell your health care provider if you have gone into preterm labor before. °This  information is not intended to replace advice given to you by your health care provider. Make sure you discuss any questions you have with your health care provider. °Document Released: 06/24/2003 Document Revised: 09/14/2015 Document Reviewed: 08/25/2015 °Elsevier Interactive Patient Education © 2019 [employer] ° °

## 2018-07-03 NOTE — MAU Provider Note (Signed)
History     CSN: [id number]  Arrival date and time: 07/03/18 1318   First Provider Initiated Contact with Patient 07/03/18 1429      Chief Complaint  Patient presents with  . Contractions   [name] is a 18 y.o. G1P0 at [redacted]w[redacted]d who presents to MAU for ctx starting today @0400 . Pt states she was woken up from sleep by the pain. Pt states the pain felt like cramping, but is "all in her gut and will go into her back." Clarified with pt that pain is being experienced in pelvic area. Pt states she has been feeling pain q10-58min since 0400 today. Pt reports pain is dull and aching and intermittent. Pt rates pain as 7/10. Pt took Procardia @0400 , pt reports it did not help with sx. Pt denies intercourse or anything in the vagina for the past 24hours.  Pt denies VB, LOF, decreased FM, vaginal discharge/odor/itching. Pt denies N/V, abdominal pain, constipation, diarrhea, or urinary problems. Pt denies fever, chills, fatigue, sweating or changes in appetite. Pt denies SOB or chest pain. Pt denies dizziness, HA, light-headedness, weakness.  Problems this pregnancy include: none. Allergies? NKDA Current medications/supplements? "nausea medication" name unknown, PNVs. Prenatal care provider? [facility], next appt scheduled for 07/11/2018.   Pelvic Pain  The patient's primary symptoms include pelvic pain. The patient's pertinent negatives include no genital itching, genital odor, genital rash, vaginal bleeding or vaginal discharge. This is a recurrent problem. The current episode started today. The problem occurs intermittently. The problem has been unchanged. The pain is moderate. The problem affects both sides. She is pregnant. Associated symptoms include back pain. Pertinent negatives include no abdominal pain, chills, constipation, diarrhea, dysuria, fever, flank pain, frequency, nausea, urgency or vomiting. Nothing aggravates the symptoms. She is sexually active.    OB  History    Gravida  1   Para      Term      Preterm      AB      Living        SAB      TAB      Ectopic      Multiple      Live Births              Past Medical History:  Diagnosis Date  . Medical history non-contributory     Past Surgical History:  Procedure Laterality Date  . CHOLECYSTECTOMY  2016  . CHOLECYSTECTOMY      Family History  Problem Relation Age of Onset  . Diabetes Mother        Type II    Social History   Tobacco Use  . Smoking status: Never Smoker  . Smokeless tobacco: Never Used  Substance Use Topics  . Alcohol use: No    Frequency: Never  . Drug use: No    Allergies: No Known Allergies  Medications Prior to Admission  Medication Sig Dispense Refill Last Dose  . acetaminophen (TYLENOL) 500 MG tablet Take 1,000 mg by mouth every 6 (six) hours as needed.   06/24/2018 at Unknown time  . cyclobenzaprine (FLEXERIL) 5 MG tablet Take 1 tablet (5 mg total) by mouth 3 (three) times daily as needed for muscle spasms. 12 tablet 0 Past Week at Unknown time  . fluticasone (FLONASE) 50 MCG/ACT nasal spray Place 2 sprays into both nostrils daily. 1 g 0 More than a month at Unknown time  . NIFEdipine (PROCARDIA) 10 MG capsule Take 1 capsule (10  mg total) by mouth every 4 (four) hours as needed. 30 capsule 1   . prenatal vitamin w/FE, FA (NATACHEW) 29-1 MG CHEW chewable tablet Chew 1 tablet by mouth daily at 12 noon.   06/24/2018 at Unknown time  . promethazine (PHENERGAN) 25 MG tablet Take 1 tablet (25 mg total) by mouth every 6 (six) hours as needed for nausea or vomiting. 30 tablet 0 06/24/2018 at Unknown time    Review of Systems  Constitutional: Negative for chills and fever.  Gastrointestinal: Negative for abdominal pain, constipation, diarrhea, nausea and vomiting.  Genitourinary: Positive for pelvic pain. Negative for dysuria, flank pain, frequency, urgency and vaginal discharge.  Musculoskeletal: Positive for back pain.   Physical Exam    Blood pressure 134/84, pulse (!) 125, temperature 98.5 F (36.9 C), temperature source Oral, resp. rate 18, weight 105.3 kg, last menstrual period 11/07/2017, SpO2 99 %.  Patient Vitals for the past 24 hrs:  BP Temp Temp src Pulse Resp SpO2 Weight  07/03/18 1353 134/84 98.5 F (36.9 C) Oral (!) 125 18 99 % 105.3 kg    Physical Exam  Constitutional: She is oriented to person, place, and time. She appears well-developed and well-nourished. No distress.  HENT:  Head: Normocephalic.  Respiratory: Effort normal.  Genitourinary:    Genitourinary Comments: Cervix thick/closed/posterior   Neurological: She is alert and oriented to person, place, and time.  Skin: Skin is warm and dry. She is not diaphoretic.  Psychiatric: She has a normal mood and affect. Her behavior is normal.   Results for orders placed or performed during the hospital encounter of 07/03/18 (from the past 24 hour(s))  Urinalysis, Routine w reflex microscopic     Status: Abnormal   Collection Time: 07/03/18  1:57 PM  Result Value Ref Range   Color, Urine YELLOW YELLOW   APPearance CLOUDY (A) CLEAR   Specific Gravity, Urine 1.014 1.005 - 1.030   pH 7.0 5.0 - 8.0   Glucose, UA NEGATIVE NEGATIVE mg/dL   Hgb urine dipstick NEGATIVE NEGATIVE   Bilirubin Urine NEGATIVE NEGATIVE   Ketones, ur NEGATIVE NEGATIVE mg/dL   Protein, ur 30 (A) NEGATIVE mg/dL   Nitrite NEGATIVE NEGATIVE   Leukocytes,Ua TRACE (A) NEGATIVE   RBC / HPF 0-5 0 - 5 RBC/hpf   WBC, UA 6-10 0 - 5 WBC/hpf   Bacteria, UA RARE (A) NONE SEEN   Squamous Epithelial / LPF 6-10 0 - 5   Mucus PRESENT    Ca Oxalate Crys, UA PRESENT     No results found.  MAU Course  Procedures  MDM -r/o labor -cervix closed/thick/posterior on exam -EFM baseline 135/140, mod variability, pos accels, neg decels. TOCO no ctx. -pulse 125, no evidence of dehydration on UA, pulse consistently between 105-137 on previously recorded visits -UA shows 30 PRO, BP 134/84, pt  denies sx other than ctx -sending urine cx based on sx and UA -fFN negative on 06/26/2018, [redacted]w[redacted]d today, fFN not performed -discharge to home in stable condition  Orders Placed This Encounter  Procedures  . Culture, OB Urine    Standing Status:   Standing    Number of Occurrences:   1  . Urinalysis, Routine w reflex microscopic    Standing Status:   Standing    Number of Occurrences:   1  . Discharge patient    Order Specific Question:   Discharge disposition    Answer:   01-Home or Self Care [1]    Order Specific Question:  Discharge patient date    Answer:   07/03/2018   No orders of the defined types were placed in this encounter.    Assessment and Plan   1. Braxton Hick's contraction   2. No leakage of amniotic fluid into vagina   3. Preterm contractions   4. [redacted] weeks gestation of pregnancy    Allergies as of 07/03/2018   No Known Allergies     Medication List    TAKE these medications   cyclobenzaprine 5 MG tablet Commonly known as:  FLEXERIL Take 1 tablet (5 mg total) by mouth 3 (three) times daily as needed for muscle spasms.   fluticasone 50 MCG/ACT nasal spray Commonly known as:  FLONASE Place 2 sprays into both nostrils daily.   NIFEdipine 10 MG capsule Commonly known as:  Procardia Take 1 capsule (10 mg total) by mouth every 4 (four) hours as needed.   prenatal vitamin w/FE, FA 29-1 MG Chew chewable tablet Chew 1 tablet by mouth daily at 12 noon.   promethazine 25 MG tablet Commonly known as:  PHENERGAN Take 1 tablet (25 mg total) by mouth every 6 (six) hours as needed for nausea or vomiting.   TYLENOL 500 MG tablet Generic drug:  acetaminophen Take 1,000 mg by mouth every 6 (six) hours as needed.      -discussed Braxton-Hicks vs. True Ctx -discussed ctx/ROM/return MAU precautions -keep regularly scheduled OB appt for next Thursday -discharged to home in stable condition  Joni Reining E Harrold Fitchett 07/03/2018, 3:07 PM

## 2018-07-03 NOTE — MAU Note (Signed)
Thinks she is having contractions again. (every 10-15 min)Has been here twice for this. No bleeding or leaking.

## 2018-07-04 LAB — CULTURE, OB URINE: Culture: <10000 — AB

## 2018-07-24 ENCOUNTER — Encounter (HOSPITAL_COMMUNITY): Payer: Self-pay

## 2018-07-24 ENCOUNTER — Other Ambulatory Visit: Payer: Self-pay

## 2018-07-24 ENCOUNTER — Inpatient Hospital Stay (HOSPITAL_COMMUNITY)
Admission: AD | Admit: 2018-07-24 | Discharge: 2018-07-24 | Disposition: A | Discharge status: Home / Self Care | Payer: Medicaid Other | Attending: Obstetrics and Gynecology | Admitting: Obstetrics and Gynecology

## 2018-07-24 DIAGNOSIS — O23593 Infection of other part of genital tract in pregnancy, third trimester: Secondary | ICD-10-CM | POA: Diagnosis present

## 2018-07-24 DIAGNOSIS — B9689 Other specified bacterial agents as the cause of diseases classified elsewhere: Secondary | ICD-10-CM | POA: Diagnosis present

## 2018-07-24 DIAGNOSIS — N760 Acute vaginitis: Secondary | ICD-10-CM

## 2018-07-24 DIAGNOSIS — Z0371 Encounter for suspected problem with amniotic cavity and membrane ruled out: Secondary | ICD-10-CM

## 2018-07-24 DIAGNOSIS — Z3A37 37 weeks gestation of pregnancy: Secondary | ICD-10-CM | POA: Insufficient documentation

## 2018-07-24 LAB — WET PREP, GENITAL
Sperm: NONE SEEN
Trich, Wet Prep: NONE SEEN
Yeast Wet Prep HPF POC: NONE SEEN

## 2018-07-24 LAB — POCT FERN TEST: POCT Fern Test: NEGATIVE

## 2018-07-24 LAB — AMNISURE RUPTURE OF MEMBRANE (ROM) NOT AT ARMC: Amnisure ROM: NEGATIVE

## 2018-07-24 MED ORDER — METRONIDAZOLE 500 MG PO TABS: 500.0000 mg | ORAL_TABLET | Freq: Two times a day (BID) | ORAL | 0 refills | Status: AC

## 2018-07-24 NOTE — MAU Provider Note (Addendum)
S: [name] is a 18 y.o. G1P0 at [redacted]w[redacted]d  who presents to MAU today complaining of leaked of fluid x 2 episodes when she sat down in a chair at work. She reports that the "chair was wet." She denies vaginal bleeding. She endorses contractions. She reports normal fetal movement.    O: BP 132/83 (BP Location: Right Arm)   Pulse (!) 112   Temp 99 F (37.2 C) (Oral)   Resp 16   Wt 110.9 kg   LMP 11/07/2017   SpO2 99%   BMI 38.31 kg/m  GENERAL: Well-developed, well-nourished female in no acute distress.  HEAD: Normocephalic, atraumatic.  CHEST: Normal effort of breathing, regular heart rate ABDOMEN: Soft, nontender, gravid PELVIC: Normal external female genitalia. Vagina is pink and rugated. Cervix with normal contour, no lesions. Normal discharge. Negative pooling.   Cervical exam:  Dilation: 1 Effacement (%): 70 Station: Ballotable Exam by:: Carloyn Jaeger CNM   Fetal Monitoring: Baseline: 145 Variability: moderate Accelerations: present Decelerations: absent Contractions: regular every 7 mins  Results for orders placed or performed during the hospital encounter of 07/24/18 (from the past 24 hour(s))  POCT fern test     Status: None   Collection Time: 07/24/18  4:05 PM  Result Value Ref Range   POCT Fern Test Negative = intact amniotic membranes   Amnisure rupture of membrane (rom)not at [facility]     Status: None   Collection Time: 07/24/18  4:24 PM  Result Value Ref Range   Amnisure ROM NEGATIVE   Wet prep, genital     Status: Abnormal   Collection Time: 07/24/18  4:29 PM  Result Value Ref Range   Yeast Wet Prep HPF POC NONE SEEN NONE SEEN   Trich, Wet Prep NONE SEEN NONE SEEN   Clue Cells Wet Prep HPF POC PRESENT (A) NONE SEEN   WBC, Wet Prep HPF POC MANY (A) NONE SEEN   Sperm NONE SEEN     A: SIUP at [redacted]w[redacted]d  Bacterial Vaginosis Membranes intact  P: Rx for Flagyl 500 mg BID x 7 days Information provided on BV   Raelyn Mora, CNM 07/24/2018, 4:56 PM

## 2018-07-24 NOTE — Discharge Instructions (Signed)
Social Distancing FAQ: How It Helps Prevent COVID-19 (Coronavirus) and Steps We Can Take to Protect Ourselves There are many things we can do to prevent the spread of COVID-19 (coronavirus): washing our hands, coughing into our elbows, avoiding touching our faces, staying home if we're feeling sick and social distancing. But what is social distancing? These frequently asked questions explain how to practice social distancing in order to protect yourself, your loved ones and your community. General Information About Social Distancing Q: What is social distancing? A: Social distancing is the practice of purposefully reducing close contact between people. According to the CDC, social distancing means: . Remaining out of "congregate settings" as much as possible. . Avoiding mass gatherings. . Maintaining distance of about 6 feet from others when possible. Q: Why is social distancing important? A: Social distancing is crucial for preventing the spread of contagious illnesses such as COVID-19 (coronavirus). COVID-19 can spread through coughing, sneezing and close contact. By minimizing the amount of close contact we have with others, we reduce our chances of catching the virus and spreading it to our loved ones and within our community. Q: Who is social distancing important for? A: Social distancing is important for all of us, but those of us who are at higher risk of serious complications caused by COVID-19 should be especially cautious about social distancing. People who are at high risk of complications include: . Older adults. . People who have serious chronic medical conditions like heart disease, diabetes and lung disease. Q: What is "flattening the curve"? What does it have to do with social distancing? A: "Flattening the curve" refers to reducing the number of people who are sick at one time. If there are high surges in the number of COVID-19 cases all at once, health care systems and resources  could potentially become overwhelmed. Efforts that help stop COVID-19 from spreading rapidly - like social distancing - help keep the number of people who are sick at one time as low as possible. Q: When should I start practicing social distancing? A: The best time to begin social distancing is before an illness like COVID-19 becomes widespread throughout your community. Each community's situation is unique, so it is important to follow the guidance of local government, health departments and health care providers. Currently in [place], gatherings of more than 10 people are banned. For the latest information on COVID-19 policies in [place], visit the [place] Department of Health and Human Services website and view news releases and executive orders. The advice below is applicable if you are symptom-free and have reasonable confidence that you have not had exposure to COVID-19. Always follow the guidance of local government, health departments and your health care providers. Visiting Public Spaces Q: How can I practice social distancing in the workplace? A: When possible, keeping about 6 feet of distance between yourself and others is key. It's also important to practice other preventative measures such as washing hands, avoiding touching your face, coughing into your elbow and staying home if you feel sick. Depending on your job and your community's situation, working from home may be an option. Always follow local guidance. Q: How can my child practice social distancing at school or at college?  A: Many schools and universities in the [place] have postponed in-person classes; currently in [place], K-12 schools have been closed until May 15 (this is subject to change). It's important to follow local guidance, as each community's situation is unique. It's important for people   of all ages to follow preventative measures, including staying about 6 feet away from others, avoiding  touching your face, washing your hands and coughing into your elbow. Q: Should I be concerned about going to the grocery store? A: In any place where large numbers of people gather, there is potential risk for disease transmission. When you visit the grocery store, keep about 6 feet between yourself and others and use prevention techniques like avoiding touching your face and washing your hands. If possible, visit the store at times when there are likely to be fewer people shopping. Q: Should I take public transportation? A: If you have the option, driving yourself, walking to work or working from home can help reduce the number of people who are using public transportation, which benefits you and your community. In any of these situations, it's very important to keep distance between yourself and others in addition to practicing other preventative measures. Q: Should I stop visiting restaurants and bars? A: Currently in [place], restaurants and bars have been closed until further notice. Always follow local guidance. Avoiding public places as much as possible helps prevent diseases from spreading. If dining out is a non-essential activity, then it is generally in the best interest of you and your loved ones to avoid it. Q: Can I still go to the gym? A: Currently in [place], gyms have been closed until further notice. Always follow local guidance. Alternatives could include exercising in your home or yard or walking through your neighborhood. If you do go to the gym, wipe down and sanitize your equipment, keep distance between yourself and others, avoid touching other people and practice other preventative techniques.  Q: What about events and places where many people gather, such as concerts, festivals, sporting events and churches? A: Risk of disease transmission is much higher in large groups of people. Effective March 25 at 5 PM in [place], meetings of over 10 people are prohibited  in order to prevent COVID-19 from spreading rapidly. Avoiding these gatherings are generally in the best interest of protecting yourself, your loved ones and your community. Always follow local guidance. Meeting with Others Q: Should I stop visiting my elderly relatives and friends?  A: Older adults are at high risk of serious complications from COVID-19. Limiting their exposure as much as possible to those who may be sick or who may be carrying the disease is crucial. This is a great opportunity to try other methods of connecting, such as over the phone or through a video chat. Q: What about social distancing with other people in my household? A: Avoiding close contact within a household is almost impossible, and social distancing is mainly focused on large groups. However, if someone in your household is sick, it's important to minimize close contact with them as much as is reasonable. Q: Should I stop meeting up with 1 or 2 friends? Should I stop dating? A: While meeting up with another person who also is symptom-free may be alright in some situations, keep in mind that risk of disease transmission is higher in public places. Now may be a good time to consider other methods of connecting with others, such as over the phone or through a video chat. Q: Can I have a small group of my extended family and/or friends over to my house? A: If possible, it's best to postpone these kinds of gatherings and look for alternative ways to connect. Avoiding non-essential gatherings is important for preventing the spread   of disease. Q: Should my family and friends cancel big gathering events like weddings and birthday parties? A: Effective March 25 at 5 PM in [place], meetings of over 10 people are prohibited in order to prevent COVID-19 from spreading rapidly. Always follow local guidance. While it's difficult to postpone important events like these, it's also very important to protect our loved ones -  especially our loved ones who are most vulnerable. It may be best to postpone or alter your plans.  Q: If I'm avoiding in-person gatherings, how can I stay connected to others? A: There are many ways you can connect with friends: phone calls, text messages, emails and video chats are all great virtual options. While physical social distancing is important for our health, so is social interaction - trying alternative ways to stay connected is a good way to take care of your emotional health. If You Are Experiencing Symptoms Q: How should I approach social distancing if I start to feel sick? A: If you begin to experience symptoms, it's important to stay home and distance yourself from others. Always follow the guidance of your health care providers and local government. Q: Can I have visitors while I am in quarantine? A: Having visitors should be avoided as much as possible. Quarantining is an important way to protect your loved ones and community from picking up the disease; visitors are at high risk of catching the illness from you. Q: Can I go outside in my yard if I am in quarantine? A: Depending on where you live and your community's situation, going outside and getting some fresh air in your yard may be safe. Always follow the guidance of your health care providers and local government.   

## 2018-07-24 NOTE — MAU Note (Signed)
Pt started leaking today at 1030. Can't feel ctx in her abdomen, feeling intermittent pain in her back rated 7/10. No bleeding. +FM

## 2018-07-25 LAB — OB RESULTS CONSOLE GBS: GBS: NEGATIVE

## 2018-07-29 ENCOUNTER — Encounter (HOSPITAL_COMMUNITY): Payer: Self-pay | Admitting: *Deleted

## 2018-07-29 ENCOUNTER — Other Ambulatory Visit: Payer: Self-pay

## 2018-07-29 ENCOUNTER — Inpatient Hospital Stay (HOSPITAL_COMMUNITY)
Admission: AD | Admit: 2018-07-29 | Discharge: 2018-08-01 | DRG: 807 | Disposition: A | Discharge status: Home / Self Care | Payer: Medicaid Other | Attending: Obstetrics and Gynecology | Admitting: Obstetrics and Gynecology

## 2018-07-29 DIAGNOSIS — O1414 Severe pre-eclampsia complicating childbirth: Secondary | ICD-10-CM | POA: Diagnosis present

## 2018-07-29 DIAGNOSIS — N760 Acute vaginitis: Secondary | ICD-10-CM

## 2018-07-29 DIAGNOSIS — Z3A36 36 weeks gestation of pregnancy: Secondary | ICD-10-CM | POA: Diagnosis not present

## 2018-07-29 DIAGNOSIS — O1413 Severe pre-eclampsia, third trimester: Secondary | ICD-10-CM

## 2018-07-29 DIAGNOSIS — B9689 Other specified bacterial agents as the cause of diseases classified elsewhere: Secondary | ICD-10-CM

## 2018-07-29 DIAGNOSIS — Z0371 Encounter for suspected problem with amniotic cavity and membrane ruled out: Secondary | ICD-10-CM

## 2018-07-29 DIAGNOSIS — Z3A37 37 weeks gestation of pregnancy: Secondary | ICD-10-CM

## 2018-07-29 DIAGNOSIS — O479 False labor, unspecified: Secondary | ICD-10-CM

## 2018-07-29 DIAGNOSIS — Z3490 Encounter for supervision of normal pregnancy, unspecified, unspecified trimester: Secondary | ICD-10-CM

## 2018-07-29 DIAGNOSIS — O1493 Unspecified pre-eclampsia, third trimester: Secondary | ICD-10-CM | POA: Diagnosis present

## 2018-07-29 DIAGNOSIS — O4700 False labor before 37 completed weeks of gestation, unspecified trimester: Secondary | ICD-10-CM

## 2018-07-29 DIAGNOSIS — R030 Elevated blood-pressure reading, without diagnosis of hypertension: Secondary | ICD-10-CM | POA: Diagnosis present

## 2018-07-29 DIAGNOSIS — Z3689 Encounter for other specified antenatal screening: Secondary | ICD-10-CM

## 2018-07-29 LAB — ABO/RH: ABO/RH(D): A POS

## 2018-07-29 LAB — CBC
HCT: 39.2 % (ref 36.0–46.0)
Hemoglobin: 12.8 g/dL (ref 12.0–15.0)
MCH: 28.2 pg (ref 26.0–34.0)
MCHC: 32.7 g/dL (ref 30.0–36.0)
MCV: 86.3 fL (ref 80.0–100.0)
Platelets: 229 10*3/uL (ref 150–400)
RBC: 4.54 MIL/uL (ref 3.87–5.11)
RDW: 12.8 % (ref 11.5–15.5)
WBC: 10.1 10*3/uL (ref 4.0–10.5)
nRBC: 0 % (ref 0.0–0.2)

## 2018-07-29 LAB — COMPREHENSIVE METABOLIC PANEL
ALT: 18 U/L (ref 0–44)
AST: 24 U/L (ref 15–41)
Albumin: 2.7 g/dL — ABNORMAL LOW (ref 3.5–5.0)
Alkaline Phosphatase: 222 U/L — ABNORMAL HIGH (ref 38–126)
Anion gap: 13 (ref 5–15)
BUN: 10 mg/dL (ref 6–20)
CO2: 19 mmol/L — ABNORMAL LOW (ref 22–32)
Calcium: 9.4 mg/dL (ref 8.9–10.3)
Chloride: 103 mmol/L (ref 98–111)
Creatinine, Ser: 0.47 mg/dL (ref 0.44–1.00)
GFR calc Af Amer: >60 mL/min (ref >60)
GFR calc non Af Amer: >60 mL/min (ref >60)
Glucose, Bld: 77 mg/dL (ref 70–99)
Potassium: 3.9 mmol/L (ref 3.5–5.1)
Sodium: 135 mmol/L (ref 135–145)
Total Bilirubin: 0.6 mg/dL (ref 0.3–1.2)
Total Protein: 6.1 g/dL — ABNORMAL LOW (ref 6.5–8.1)

## 2018-07-29 LAB — URINALYSIS, ROUTINE W REFLEX MICROSCOPIC
Bilirubin Urine: NEGATIVE
Glucose, UA: NEGATIVE mg/dL
Hgb urine dipstick: NEGATIVE
Ketones, ur: NEGATIVE mg/dL
Nitrite: NEGATIVE
Protein, ur: NEGATIVE mg/dL
Specific Gravity, Urine: 1.011 (ref 1.005–1.030)
pH: 6 (ref 5.0–8.0)

## 2018-07-29 LAB — OB RESULTS CONSOLE ABO/RH: RH Type: POSITIVE

## 2018-07-29 LAB — TYPE AND SCREEN
ABO/RH(D): A POS
Antibody Screen: NEGATIVE

## 2018-07-29 LAB — PROTEIN / CREATININE RATIO, URINE
Creatinine, Urine: 43.4 mg/dL
Protein Creatinine Ratio: 0.35 mg/mg{Cre} — ABNORMAL HIGH (ref 0.00–0.15)
Total Protein, Urine: 15 mg/dL

## 2018-07-29 LAB — OB RESULTS CONSOLE GBS: GBS: NEGATIVE

## 2018-07-29 MED ORDER — SOD CITRATE-CITRIC ACID 500-334 MG/5ML PO SOLN: 30.0000 mL | ORAL | Status: DC | PRN

## 2018-07-29 MED ORDER — OXYCODONE-ACETAMINOPHEN 5-325 MG PO TABS: 2.0000 | ORAL_TABLET | ORAL | Status: DC | PRN

## 2018-07-29 MED ORDER — ONDANSETRON HCL 4 MG/2ML IJ SOLN
4.0000 mg | Freq: Four times a day (QID) | INTRAMUSCULAR | Status: DC | PRN
  Administered 2018-07-30: 4 mg via INTRAVENOUS
  Filled 2018-07-29: qty 2

## 2018-07-29 MED ORDER — CYCLOBENZAPRINE HCL 10 MG PO TABS
10.0000 mg | ORAL_TABLET | Freq: Once | ORAL | Status: AC
  Administered 2018-07-29: 10 mg via ORAL
  Filled 2018-07-29: qty 1

## 2018-07-29 MED ORDER — TERBUTALINE SULFATE 1 MG/ML IJ SOLN: 0.2500 mg | Freq: Once | INTRAMUSCULAR | Status: DC | PRN

## 2018-07-29 MED ORDER — FENTANYL CITRATE (PF) 100 MCG/2ML IJ SOLN: 50.0000 ug | INTRAMUSCULAR | Status: DC | PRN

## 2018-07-29 MED ORDER — MISOPROSTOL 25 MCG QUARTER TABLET
25.0000 ug | ORAL_TABLET | ORAL | Status: DC | PRN
  Administered 2018-07-29 – 2018-07-30 (×2): 25 ug via VAGINAL
  Filled 2018-07-29 (×4): qty 1

## 2018-07-29 MED ORDER — MAGNESIUM SULFATE BOLUS VIA INFUSION
4.0000 g | Freq: Once | INTRAVENOUS | Status: AC
  Administered 2018-07-29: 4 g via INTRAVENOUS
  Filled 2018-07-29: qty 500

## 2018-07-29 MED ORDER — LACTATED RINGERS IV SOLN
500.0000 mL | INTRAVENOUS | Status: DC | PRN
  Administered 2018-07-30: 1000 mL via INTRAVENOUS

## 2018-07-29 MED ORDER — LIDOCAINE HCL (PF) 1 % IJ SOLN
30.0000 mL | INTRAMUSCULAR | Status: DC | PRN
  Filled 2018-07-29: qty 30

## 2018-07-29 MED ORDER — LABETALOL HCL 5 MG/ML IV SOLN: 80.0000 mg | INTRAVENOUS | Status: DC | PRN

## 2018-07-29 MED ORDER — MAGNESIUM SULFATE 40 G IN LACTATED RINGERS - SIMPLE
2.0000 g/h | INTRAVENOUS | Status: AC
  Administered 2018-07-30 – 2018-07-31 (×2): 2 g/h via INTRAVENOUS
  Filled 2018-07-29 (×2): qty 500

## 2018-07-29 MED ORDER — LABETALOL HCL 5 MG/ML IV SOLN: 40.0000 mg | INTRAVENOUS | Status: DC | PRN

## 2018-07-29 MED ORDER — OXYTOCIN 40 UNITS IN NORMAL SALINE INFUSION - SIMPLE MED
2.5000 [IU]/h | INTRAVENOUS | Status: DC
  Filled 2018-07-29: qty 1000

## 2018-07-29 MED ORDER — LABETALOL HCL 5 MG/ML IV SOLN: 20.0000 mg | INTRAVENOUS | Status: DC | PRN

## 2018-07-29 MED ORDER — HYDRALAZINE HCL 20 MG/ML IJ SOLN: 10.0000 mg | INTRAMUSCULAR | Status: DC | PRN

## 2018-07-29 MED ORDER — OXYTOCIN BOLUS FROM INFUSION
500.0000 mL | Freq: Once | INTRAVENOUS | Status: AC
  Administered 2018-07-30: 500 mL via INTRAVENOUS

## 2018-07-29 MED ORDER — ACETAMINOPHEN 325 MG PO TABS
650.0000 mg | ORAL_TABLET | ORAL | Status: DC | PRN
  Administered 2018-07-29 – 2018-07-30 (×2): 650 mg via ORAL
  Filled 2018-07-29 (×2): qty 2

## 2018-07-29 MED ORDER — LACTATED RINGERS IV SOLN
INTRAVENOUS | Status: DC
  Administered 2018-07-29 – 2018-07-30 (×6): via INTRAVENOUS

## 2018-07-29 MED ORDER — OXYCODONE-ACETAMINOPHEN 5-325 MG PO TABS: 1.0000 | ORAL_TABLET | ORAL | Status: DC | PRN

## 2018-07-29 NOTE — H&P (Signed)
18 y.o. G1P0 @ [redacted]w[redacted]d presents from the office for evaluation for high blood pressure.  She was seen for a routine visit today and noted to have a BP of 130/90.  She reports an episode of nausea and vomiting this AM, along with waking up with a headache that has been persistent despite tylenol.  She reports blurry vision, but no scotomata.  She denies epigastric pain.  In MAU, she was noted to have BPs consistently >140/90, with the most recent being severe at 165/90.  She received a dose of flexeril in the MAU, but headache is still present.   She was on a baby aspirin this pregnancy for elevated risk of preeclampsia (nulliparity and obesity). Otherwise has good fetal movement and no bleeding.    Past Medical History:  Diagnosis Date  . Medical history non-contributory     Past Surgical History:  Procedure Laterality Date  . CHOLECYSTECTOMY  2016  . CHOLECYSTECTOMY      OB History  Gravida Para Term Preterm AB Living  1            SAB TAB Ectopic Multiple Live Births               # Outcome Date GA Lbr Len/2nd Weight Sex Delivery Anes PTL Lv  1 Current             Social History   Socioeconomic History  . Marital status: Single    Spouse name: Not on file  . Number of children: Not on file  . Years of education: Not on file  . Highest education level: Not on file  Occupational History  . Not on file  Social Needs  . Financial resource strain: Not on file  . Food insecurity:    Worry: Not on file    Inability: Not on file  . Transportation needs:    Medical: Not on file    Non-medical: Not on file  Tobacco Use  . Smoking status: Never Smoker  . Smokeless tobacco: Never Used  Substance and Sexual Activity  . Alcohol use: No    Frequency: Never  . Drug use: No  . Sexual activity: Not Currently   Patient has no known allergies.    Prenatal Transfer Tool  Maternal Diabetes: No Genetic Screening: Declined Maternal Ultrasounds/Referrals: Normal Fetal Ultrasounds or  other Referrals:  None Maternal Substance Abuse:  No Significant Maternal Medications:  None Significant Maternal Lab Results: Lab values include: Group B Strep negative  ABO, Rh: --/--/A POS Performed at [facility], [address], [place], [place] [address]  [phone] [id number]) Antibody:  Neg Rubella:  Non-immune RPR:   NR HBsAg:   Neg HIV: Non Reactive (09/14 1554)  GBS:   Neg     Vitals:   07/29/18 1800 07/29/18 1815  BP: (!) 151/103 (!) 165/90  Pulse: (!) 109 (!) 108  Resp:    Temp:    SpO2:       General:  NAD Abdomen:  soft, gravid, EFW 7# Ex:  1+ edema SVE:  1/60/-3 FHTs:  130s, moderate variability, category 1 Toco:  q6-10 minutes   A/P   18 y.o. G1P0 [redacted]w[redacted]d presents with severe preeclampsia Admit to L&D Preeclampsia--severe by BP and headache.  BP resolved without treatment, but given persistent HA and nausea, will start magnesium now Cervical ripening with cytotec FSR/ vtx/ GBS neg  Kindell Strada GEFFEL Paije Goodhart

## 2018-07-29 NOTE — MAU Provider Note (Signed)
History     CSN: [id number]  Arrival date and time: 07/29/18 1504   First Provider Initiated Contact with Patient 07/29/18 1549      Chief Complaint  Patient presents with  . Hypertension  . Headache   [name] is a 18 y.o. G1P0 at [redacted]w[redacted]d who presents to MAU for preeclampsia evaluation after she was seen in the office today and had an elevated BP of 130s/90s and was told to come to MAU for evaluation by her provider.  Pt reports a HA that began this morning when she woke up. Pt reports she took "2 pills" of Tylenol at 10:30am, with no relief. Pt currently rates HA as 6/10. Pt denies hx of HAs or migraines. Pt also reports that her "vision has been worse than normal" in both eyes "for the past few days". Pt does wear contacts, but reports even after changing to new contacts she cannot see clearly. Pt reports vision is constantly blurry.  Pt also reports vomiting x1 after breakfast and drinking water this morning. Pt reports nothing to eat or drink since that time. Pt reports taking 1 tab of phenergan this morning without relief. Pt denies nausea/vomiting since this morning or at this time in MAU.  Pt also reports urinary urgency since this morning, but when she attempts to go to the bathroom nothing comes out. Pt reports attempting to urinate "a bunch of times" without a normal urination. Pt denies dysuria or frequency. Pt was able to give a normal urine sample in MAU.  Pt denies seeing spots, epigastric pain, swelling in face and hands, sudden weight gain. Pt denies chest pain and SOB.  Pt denies constipation, diarrhea. Pt denies fever, chills, fatigue, sweating or changes in appetite. Pt denies dizziness, light-headedness, weakness.  Pt denies VB, ctx, LOF and reports good FM.  Current pregnancy problems? none. Blood Type? A positive Allergies? NKDA Current medications? PNVs, metronidazole, phenergan (2 tabs QS) Current PNC & next appt? [facility], Monday  08/05/2018   OB History    Gravida  1   Para      Term      Preterm      AB      Living        SAB      TAB      Ectopic      Multiple      Live Births              Past Medical History:  Diagnosis Date  . Medical history non-contributory     Past Surgical History:  Procedure Laterality Date  . CHOLECYSTECTOMY  2016  . CHOLECYSTECTOMY      Family History  Problem Relation Age of Onset  . Diabetes Mother        Type II    Social History   Tobacco Use  . Smoking status: Never Smoker  . Smokeless tobacco: Never Used  Substance Use Topics  . Alcohol use: No    Frequency: Never  . Drug use: No    Allergies: No Known Allergies  Medications Prior to Admission  Medication Sig Dispense Refill Last Dose  . acetaminophen (TYLENOL) 500 MG tablet Take 1,000 mg by mouth every 6 (six) hours as needed.   07/29/2018 at 1030  . metroNIDAZOLE (FLAGYL) 500 MG tablet Take 1 tablet (500 mg total) by mouth 2 (two) times daily for 7 days. 14 tablet 0 07/29/2018 at 0900  . prenatal vitamin w/FE, FA (  NATACHEW) 29-1 MG CHEW chewable tablet Chew 1 tablet by mouth daily at 12 noon.   07/28/2018 at 2200  . promethazine (PHENERGAN) 25 MG tablet Take 1 tablet (25 mg total) by mouth every 6 (six) hours as needed for nausea or vomiting. 30 tablet 0 07/29/2018 at 0900  . cyclobenzaprine (FLEXERIL) 5 MG tablet Take 1 tablet (5 mg total) by mouth 3 (three) times daily as needed for muscle spasms. 12 tablet 0 Past Week at Unknown time  . fluticasone (FLONASE) 50 MCG/ACT nasal spray Place 2 sprays into both nostrils daily. 1 g 0 More than a month at Unknown time  . NIFEdipine (PROCARDIA) 10 MG capsule Take 1 capsule (10 mg total) by mouth every 4 (four) hours as needed. 30 capsule 1     Review of Systems  Constitutional: Negative for appetite change, chills, diaphoresis, fatigue and fever.  Respiratory: Negative for shortness of breath.   Cardiovascular: Negative for chest pain.   Gastrointestinal: Positive for nausea and vomiting. Negative for abdominal pain, constipation and diarrhea.  Genitourinary: Positive for urgency. Negative for dysuria, flank pain, frequency, pelvic pain, vaginal bleeding and vaginal discharge.  Musculoskeletal: Positive for back pain (low back pain, on-going prior to reason for MAU visit).  Neurological: Positive for headaches. Negative for dizziness, weakness and light-headedness.   Physical Exam   Blood pressure (!) 165/90, pulse (!) 108, temperature 98.5 F (36.9 C), temperature source Oral, resp. rate 18, height 5\' 7"  (1.702 m), weight 109.5 kg, last menstrual period 11/07/2017, SpO2 98 %.   Patient Vitals for the past 24 hrs:  BP Temp Temp src Pulse Resp SpO2 Height Weight  07/29/18 1815 (!) 165/90 - - (!) 108 - - - -  07/29/18 1800 (!) 151/103 - - (!) 109 - - - -  07/29/18 1745 (!) 157/92 - - (!) 107 - - - -  07/29/18 1715 (!) 148/94 - - (!) 116 - - - -  07/29/18 1701 (!) 143/100 - - (!) 111 - - - -  07/29/18 1646 (!) 149/93 - - (!) 118 - - - -  07/29/18 1630 (!) 143/86 - - (!) 115 - - - -  07/29/18 1616 137/81 - - (!) 101 - - - -  07/29/18 1600 (!) 142/87 - - (!) 105 - - - -  07/29/18 1545 (!) 136/94 - - (!) 117 - - - -  07/29/18 1521 128/89 98.5 F (36.9 C) Oral (!) 111 18 98 % 5\' 7"  (1.702 m) 109.5 kg   BP for pt on multiple visits to MAU prior to today's visit has been 120s-130s/70s-80s. Pulse for pt during MAU visits ranging from 105-137.  Physical Exam  Constitutional: She is oriented to person, place, and time. She appears well-developed and well-nourished. No distress.  HENT:  Head: Normocephalic and atraumatic.  Respiratory: Effort normal.  GI: Soft. She exhibits no distension and no mass. There is no abdominal tenderness. There is no rebound, no guarding and no CVA tenderness.  Neurological: She is alert and oriented to person, place, and time.  Skin: Skin is warm and dry. She is not diaphoretic.  Psychiatric:  She has a normal mood and affect. Her behavior is normal.   Results for orders placed or performed during the hospital encounter of 07/29/18 (from the past 24 hour(s))  Urinalysis, Routine w reflex microscopic     Status: Abnormal   Collection Time: 07/29/18  3:32 PM  Result Value Ref Range   Color, Urine YELLOW YELLOW  APPearance HAZY (A) CLEAR   Specific Gravity, Urine 1.011 1.005 - 1.030   pH 6.0 5.0 - 8.0   Glucose, UA NEGATIVE NEGATIVE mg/dL   Hgb urine dipstick NEGATIVE NEGATIVE   Bilirubin Urine NEGATIVE NEGATIVE   Ketones, ur NEGATIVE NEGATIVE mg/dL   Protein, ur NEGATIVE NEGATIVE mg/dL   Nitrite NEGATIVE NEGATIVE   Leukocytes,Ua TRACE (A) NEGATIVE   RBC / HPF 0-5 0 - 5 RBC/hpf   WBC, UA 0-5 0 - 5 WBC/hpf   Bacteria, UA RARE (A) NONE SEEN   Squamous Epithelial / LPF 0-5 0 - 5  Protein / creatinine ratio, urine     Status: Abnormal   Collection Time: 07/29/18  3:32 PM  Result Value Ref Range   Creatinine, Urine 43.40 mg/dL   Total Protein, Urine 15 mg/dL   Protein Creatinine Ratio 0.35 (H) 0.00 - 0.15 mg/mg[Cre]  CBC     Status: None   Collection Time: 07/29/18  4:18 PM  Result Value Ref Range   WBC 10.1 4.0 - 10.5 K/uL   RBC 4.54 3.87 - 5.11 MIL/uL   Hemoglobin 12.8 12.0 - 15.0 g/dL   HCT 65.7 84.6 - 96.2 %   MCV 86.3 80.0 - 100.0 fL   MCH 28.2 26.0 - 34.0 pg   MCHC 32.7 30.0 - 36.0 g/dL   RDW 95.2 84.1 - 32.4 %   Platelets 229 150 - 400 K/uL   nRBC 0.0 0.0 - 0.2 %  Comprehensive metabolic panel     Status: Abnormal   Collection Time: 07/29/18  4:18 PM  Result Value Ref Range   Sodium 135 135 - 145 mmol/L   Potassium 3.9 3.5 - 5.1 mmol/L   Chloride 103 98 - 111 mmol/L   CO2 19 (L) 22 - 32 mmol/L   Glucose, Bld 77 70 - 99 mg/dL   BUN 10 6 - 20 mg/dL   Creatinine, Ser 4.01 0.44 - 1.00 mg/dL   Calcium 9.4 8.9 - 02.7 mg/dL   Total Protein 6.1 (L) 6.5 - 8.1 g/dL   Albumin 2.7 (L) 3.5 - 5.0 g/dL   AST 24 15 - 41 U/L   ALT 18 0 - 44 U/L   Alkaline Phosphatase  222 (H) 38 - 126 U/L   Total Bilirubin 0.6 0.3 - 1.2 mg/dL   GFR calc non Af Amer >60 >60 mL/min   GFR calc Af Amer >60 >60 mL/min   Anion gap 13 5 - 15   No results found.  MAU Course  Procedures  MDM -r/o preeclampsia -UA: hazy/trace leukocytes/rare bacteria, urine cx added based on pt symptoms -CBC: WNL, platelets 229 -CMP: WNL for pregnancy, AST/ALT 24/18, serum Cr 0.47 -PCr: 0.35 -flexeril for HA, after administration pt rates pain reduced to 3/10 EFM: reactive with variables       -baseline: 135-145       -variability: moderate       -accels: present, 15x15       -decels: few variable       -TOCO: irritability, irregular ctx -d/t PCr and BPs, pt dx with preeclampsia -called and spoke with Dr. Debroah Loop , relayed above information and recommended pt be admitted for induction. Dr. Debroah Loop agrees with plan, next step is to place call to private. -spoke with Dr. Chestine Spore , relayed above information and Dr. Chestine Spore reviewed FHR tracing. Per Dr. Chestine Spore, pt to go home, can have a light meal, return  for induction, scheduled by Dr. Chestine Spore. -immediately after speaking with Dr.  Clark, pt had severe range pressure. Dr. Chestine Sporelark called back without prompting and decided to admit pt. Dr. Chestine Sporelark to put in admission orders. Pt care transferred to Dr. Chestine Sporelark @1822 . , Odie SeraNicole E, NP  6:49 PM 07/29/2018   Orders Placed This Encounter  Procedures  . Culture, OB Urine    Standing Status:   Standing    Number of Occurrences:   1  . Urinalysis, Routine w reflex microscopic    Standing Status:   Standing    Number of Occurrences:   1  . CBC    Standing Status:   Standing    Number of Occurrences:   1  . Comprehensive metabolic panel    Standing Status:   Standing    Number of Occurrences:   1  . Protein / creatinine ratio, urine    Standing Status:   Standing    Number of Occurrences:   1  . RPR    Standing Status:   Standing    Number of Occurrences:   1  . Diet clear liquid  Room service appropriate? Yes; Fluid consistency: Thin    Standing Status:   Standing    Number of Occurrences:   1    Order Specific Question:   Room service appropriate?    Answer:   Yes    Order Specific Question:   Fluid consistency:    Answer:   Thin  . Vitals signs per unit policy    Standing Status:   Standing    Number of Occurrences:   1  . Notify Physician    Standing Status:   Standing    Number of Occurrences:   1    Order Specific Question:   Notify Physician    Answer:   for vaginal bleeding    Order Specific Question:   Notify Physician    Answer:   for acute abdominal pain    Order Specific Question:   Notify Physician    Answer:   for temperature >/= 100.4 F    Order Specific Question:   Notify Physician    Answer:   for significant change in vital signs    Order Specific Question:   Notify Physician    Answer:   for non-reassuring fetal heart rate pattern    Order Specific Question:   Notify Physician    Answer:   for imminent delivery or failure to progress  . Fetal monitoring per unit policy    Standing Status:   Standing    Number of Occurrences:   1  . Activity as tolerated    Standing Status:   Standing    Number of Occurrences:   1  . Cervical Exam    Unless contraindicated, every 1-2 hours in active labor, or at nurse's discretion    Standing Status:   Standing    Number of Occurrences:   1  . Measure blood pressure post delivery every 15 min x 1 hour then every 30 min x 1 hour    Standing Status:   Standing    Number of Occurrences:   1  . Fundal check post delivery every 15 min x 1 hour then every 30 min x 1 hour    Standing Status:   Standing    Number of Occurrences:   1  . Patient may have epidural placement upon request    Standing Status:   Standing    Number of Occurrences:   1  . If  Rapid HIV test positive or known HIV positive: initiate AZT orders    Standing Status:   Standing    Number of Occurrences:   1  . May in and out cath x 2  for inability to void    Standing Status:   Standing    Number of Occurrences:   [phone]  . Insert foley catheter    1) If straight catheterized greater than 2 times or patient unable to void post epidural placement.  2) PRN for bladder distention.      Standing Status:   Standing    Number of Occurrences:   1  . Discontinue foley prior to vaginal delivery    Standing Status:   Standing    Number of Occurrences:   1  . Initiate Carrier Fluid Protocol    Standing Status:   Standing    Number of Occurrences:   1  . Initiate Oral Care Protocol    Standing Status:   Standing    Number of Occurrences:   1  . Notify Physician    Confirmatory reading of BP> 160/110 15 minutes later    Standing Status:   Standing    Number of Occurrences:   1    Order Specific Question:   Notify Physician    Answer:   Temp greater than or equal to 100.4    Order Specific Question:   Notify Physician    Answer:   RR greater than 24 or less than 10    Order Specific Question:   Notify Physician    Answer:   HR greater than 120 or less than 50    Order Specific Question:   Notify Physician    Answer:   SBP greater than 160 mmHG or less than 80 mmHG    Order Specific Question:   Notify Physician    Answer:   DBP greater than 110 mmHG or less than 45 mmHG    Order Specific Question:   Notify Physician    Answer:   Urinary output is less than for any 4 hour period  . Evaluate fetal heart rate to establish reassuring pattern prior to initiating Cytotec or Pitocin    Standing Status:   Standing    Number of Occurrences:   1  . Perform a cervical exam prior to initiating Cytotec or Pitocin    Standing Status:   Standing    Number of Occurrences:   1  . Discontinue Pitocin if tachysystole with non-reassuring FHR is present    Tachysystole is defined as more than 5 contractions in a 10-minute time period averaged over a 30-minute window    Standing Status:   Standing    Number of Occurrences:   1  .  Nofify MD/CNM if tachysystole with non-reassuring FHR is present    Tachysystole is defined as more than 5 contractions in a 10-minute time period averaged over a 30-minute    Standing Status:   Standing    Number of Occurrences:   1  . Initiate intrauterine resuscitation if tachysystole with non-reasuring FHR is present    Tachysystole is defined as more than 5 contractions in a 10-minute time period averaged over a 30-minute window    Standing Status:   Standing    Number of Occurrences:   1  . If tachysystole WITH reassuring FHR present notify MD / CNM    tachysystole is defined as more than 5 contractions in a 10 minute time period  averaged over a 30 minute window     Standing Status:   Standing    Number of Occurrences:   1  . May administer Terbutaline 0.25 mg SQ x 1 dose if tachysystole with non-reassuring FHR is presesnt    Tachysystole is defined as more than 5 contractions in a 10-minute time period averaged over a 30-minute window    Standing Status:   Standing    Number of Occurrences:   1  . Labor Induction    Standing Status:   Standing    Number of Occurrences:   1    Order Specific Question:   Clinical Indication    Answer:   severe preeclampsia    Order Specific Question:   Gestational age    Answer:   109.6    Order Specific Question:   Estimated fetal size    Answer:   AGA    Order Specific Question:   Adequate pelvis    Answer:   Yes    Order Specific Question:   Presenting part    Answer:   Vertex    Order Specific Question:   Bishop score    Answer:   <8  . SCDs    Standing Status:   Standing    Number of Occurrences:   1    Order Specific Question:   Laterality    Answer:   Bilateral  . Measure blood pressure    20 minutes after giving hydralazine 10 MG IV dose.  Call MD if SBP >/= 160 or DBP >/= 110.    Standing Status:   Standing    Number of Occurrences:   1  . Full code    Standing Status:   Standing    Number of Occurrences:   1  . Oxygen therapy     Standing Status:   Standing    Number of Occurrences:   20  . Insert and maintain IV Line    Standing Status:   Standing    Number of Occurrences:   1  . Admit to Inpatient (patient's expected length of stay will be greater than 2 midnights or inpatient only procedure)    Standing Status:   Standing    Number of Occurrences:   1    Order Specific Question:   Hospital Area    Answer:   [facility] [[id number]]    Order Specific Question:   Level of Care    Answer:   Labor and Delivery [101]    Order Specific Question:   Diagnosis    Answer:   Preeclampsia, third trimester [870030]    Order Specific Question:   Admitting Physician    Answer:   Marlow Baars [[id number]]    Order Specific Question:   Attending Physician    Answer:   Marlow Baars [phone]    Order Specific Question:   Estimated length of stay    Answer:   3 - 4 days    Order Specific Question:   Certification:    Answer:   I certify this patient will need inpatient services for at least 2 midnights    Order Specific Question:   PT Class (Do Not Modify)    Answer:   Inpatient [101]    Order Specific Question:   PT Acc Code (Do Not Modify)    Answer:   Private [1]   Meds ordered this encounter  Medications  . cyclobenzaprine (FLEXERIL) tablet 10 mg  . lactated  ringers infusion  . oxytocin (PITOCIN) IV BOLUS FROM BAG  . oxytocin (PITOCIN) IV infusion 40 units in NS 1000 mL - Premix  . lactated ringers infusion 500-1,000 mL  . acetaminophen (TYLENOL) tablet 650 mg  . oxyCODONE-acetaminophen (PERCOCET/ROXICET) 5-325 MG per tablet 1 tablet  . oxyCODONE-acetaminophen (PERCOCET/ROXICET) 5-325 MG per tablet 2 tablet  . fentaNYL (SUBLIMAZE) injection 50-100 mcg  . ondansetron (ZOFRAN) injection 4 mg  . sodium citrate-citric acid (ORACIT) solution 30 mL  . lidocaine (PF) (XYLOCAINE) 1 % injection 30 mL  . terbutaline (BRETHINE) injection 0.25 mg  . misoprostol (CYTOTEC) tablet 25 mcg  . AND Linked Order  Group   . labetalol (NORMODYNE,TRANDATE) injection 20 mg   . labetalol (NORMODYNE,TRANDATE) injection 40 mg   . labetalol (NORMODYNE,TRANDATE) injection 80 mg   . hydrALAZINE (APRESOLINE) injection 10 mg  . magnesium bolus via infusion 4 g  . magnesium sulfate 40 grams in LR 500 mL OB infusion    Assessment and Plan   1. Pre-eclampsia in third trimester, severe  2. NST (non-stress test) reactive   3. [redacted] weeks gestation of pregnancy             -preeclampsia with severe features -pt admitted to L&D by Dr. Chestine Spore for induction -care transferred to Dr. Chestine Spore @1822   Odie Sera  07/29/2018, 6:49 PM

## 2018-07-29 NOTE — MAU Note (Signed)
Sent over from office, BP was high today.  Vision has been off the last couple days. Has a HA (took Tylenol, was unable to keep it down), was throwing up this morning, feet stay swollen, denies epigastric pain.  Stays is peeing very small amts. Denies bleeding or leaking.

## 2018-07-30 ENCOUNTER — Encounter (HOSPITAL_COMMUNITY): Payer: Self-pay | Admitting: *Deleted

## 2018-07-30 ENCOUNTER — Inpatient Hospital Stay (HOSPITAL_COMMUNITY): Payer: Medicaid Other | Admitting: Anesthesiology

## 2018-07-30 LAB — CBC
HCT: 38.1 % (ref 36.0–46.0)
Hemoglobin: 12.8 g/dL (ref 12.0–15.0)
MCH: 29 pg (ref 26.0–34.0)
MCHC: 33.6 g/dL (ref 30.0–36.0)
MCV: 86.4 fL (ref 80.0–100.0)
Platelets: 213 10*3/uL (ref 150–400)
RBC: 4.41 MIL/uL (ref 3.87–5.11)
RDW: 12.9 % (ref 11.5–15.5)
WBC: 11.8 10*3/uL — ABNORMAL HIGH (ref 4.0–10.5)
nRBC: 0 % (ref 0.0–0.2)

## 2018-07-30 LAB — CULTURE, OB URINE: Culture: NO GROWTH

## 2018-07-30 LAB — RPR: RPR Ser Ql: NONREACTIVE

## 2018-07-30 MED ORDER — LACTATED RINGERS IV SOLN
500.0000 mL | Freq: Once | INTRAVENOUS | Status: AC
  Administered 2018-07-30: 1000 mL via INTRAVENOUS

## 2018-07-30 MED ORDER — FENTANYL-BUPIVACAINE-NACL 0.5-0.125-0.9 MG/250ML-% EP SOLN
EPIDURAL | Status: AC
  Filled 2018-07-30: qty 250

## 2018-07-30 MED ORDER — DIPHENHYDRAMINE HCL 50 MG/ML IJ SOLN: 12.5000 mg | INTRAMUSCULAR | Status: DC | PRN

## 2018-07-30 MED ORDER — METHYLERGONOVINE MALEATE 0.2 MG/ML IJ SOLN
INTRAMUSCULAR | Status: AC
  Filled 2018-07-30: qty 1

## 2018-07-30 MED ORDER — EPHEDRINE 5 MG/ML INJ
10.0000 mg | INTRAVENOUS | Status: DC | PRN
  Filled 2018-07-30: qty 2

## 2018-07-30 MED ORDER — MISOPROSTOL 50MCG HALF TABLET
50.0000 ug | ORAL_TABLET | Freq: Once | ORAL | Status: AC
  Administered 2018-07-30: 50 ug via ORAL

## 2018-07-30 MED ORDER — PHENYLEPHRINE 40 MCG/ML (10ML) SYRINGE FOR IV PUSH (FOR BLOOD PRESSURE SUPPORT)
80.0000 ug | PREFILLED_SYRINGE | INTRAVENOUS | Status: DC | PRN
  Administered 2018-07-30 (×2): 80 ug via INTRAVENOUS
  Filled 2018-07-30: qty 10

## 2018-07-30 MED ORDER — LIDOCAINE-EPINEPHRINE (PF) 2 %-1:200000 IJ SOLN
INTRAMUSCULAR | Status: DC | PRN
  Administered 2018-07-30: 5 mL via EPIDURAL
  Administered 2018-07-30: 2 mL via EPIDURAL

## 2018-07-30 MED ORDER — METHYLERGONOVINE MALEATE 0.2 MG/ML IJ SOLN
0.2000 mg | Freq: Once | INTRAMUSCULAR | Status: AC
  Administered 2018-07-30: 0.2 mg via INTRAMUSCULAR

## 2018-07-30 MED ORDER — PHENYLEPHRINE 40 MCG/ML (10ML) SYRINGE FOR IV PUSH (FOR BLOOD PRESSURE SUPPORT)
80.0000 ug | PREFILLED_SYRINGE | INTRAVENOUS | Status: AC | PRN
  Administered 2018-07-30 (×3): 80 ug via INTRAVENOUS
  Filled 2018-07-30: qty 10

## 2018-07-30 MED ORDER — MISOPROSTOL 50MCG HALF TABLET
ORAL_TABLET | ORAL | Status: AC
  Filled 2018-07-30: qty 1

## 2018-07-30 MED ORDER — FENTANYL-BUPIVACAINE-NACL 0.5-0.125-0.9 MG/250ML-% EP SOLN: 12.0000 mL/h | EPIDURAL | Status: DC | PRN

## 2018-07-30 MED ORDER — TERBUTALINE SULFATE 1 MG/ML IJ SOLN: 0.2500 mg | Freq: Once | INTRAMUSCULAR | Status: DC | PRN

## 2018-07-30 MED ORDER — OXYTOCIN 40 UNITS IN NORMAL SALINE INFUSION - SIMPLE MED
1.0000 m[IU]/min | INTRAVENOUS | Status: DC
  Administered 2018-07-30: 2 m[IU]/min via INTRAVENOUS
  Filled 2018-07-30: qty 1000

## 2018-07-30 MED ORDER — SODIUM CHLORIDE (PF) 0.9 % IJ SOLN
INTRAMUSCULAR | Status: DC | PRN
  Administered 2018-07-30: 14 mL/h via EPIDURAL

## 2018-07-30 MED ORDER — FENTANYL CITRATE (PF) 100 MCG/2ML IJ SOLN
100.0000 ug | INTRAMUSCULAR | Status: DC | PRN
  Administered 2018-07-30: 100 ug via INTRAVENOUS
  Filled 2018-07-30: qty 2

## 2018-07-30 NOTE — Progress Notes (Signed)
Pt now on pitocin. REceived 3 cytotecs. AROM with clear fluid. Cx 60/1/-3 vtx VSSAF- all B/Ps wnl IMP/ stable Plan/ Induction

## 2018-07-30 NOTE — Anesthesia Preprocedure Evaluation (Signed)

## 2018-07-30 NOTE — Anesthesia Procedure Notes (Signed)
Epidural Patient location during procedure: OB Start time: 07/30/2018 1:05 PM End time: 07/30/2018 1:27 PM  Staffing Anesthesiologist: Lucretia Kern, MD Performed: anesthesiologist   Preanesthetic Checklist Completed: patient identified, pre-op evaluation, timeout performed, IV checked, risks and benefits discussed and monitors and equipment checked  Epidural Patient position: sitting Prep: DuraPrep Patient monitoring: heart rate, continuous pulse ox and blood pressure Approach: midline Location: L3-L4 Injection technique: LOR air  Needle:  Needle type: Tuohy  Needle gauge: 17 G Needle length: 9 cm Needle insertion depth: 8 cm Catheter type: closed end flexible Catheter size: 19 Gauge Catheter at skin depth: 13 cm Test dose: negative and 2% lidocaine with Epi 1:200 K  Assessment Events: blood not aspirated, injection not painful, no injection resistance, negative IV test and no paresthesia  Additional Notes Reason for block:procedure for pain

## 2018-07-31 ENCOUNTER — Encounter (HOSPITAL_COMMUNITY): Payer: Self-pay

## 2018-07-31 DIAGNOSIS — Z3490 Encounter for supervision of normal pregnancy, unspecified, unspecified trimester: Secondary | ICD-10-CM

## 2018-07-31 LAB — CBC
HCT: 34.7 % — ABNORMAL LOW (ref 36.0–46.0)
HCT: 30.9 % — ABNORMAL LOW (ref 36.0–46.0)
Hemoglobin: 11.8 g/dL — ABNORMAL LOW (ref 12.0–15.0)
Hemoglobin: 10.4 g/dL — ABNORMAL LOW (ref 12.0–15.0)
MCH: 29.6 pg (ref 26.0–34.0)
MCH: 29.3 pg (ref 26.0–34.0)
MCHC: 34 g/dL (ref 30.0–36.0)
MCHC: 33.7 g/dL (ref 30.0–36.0)
MCV: 87.2 fL (ref 80.0–100.0)
MCV: 87 fL (ref 80.0–100.0)
Platelets: 214 10*3/uL (ref 150–400)
Platelets: 194 10*3/uL (ref 150–400)
RBC: 3.98 MIL/uL (ref 3.87–5.11)
RBC: 3.55 MIL/uL — ABNORMAL LOW (ref 3.87–5.11)
RDW: 13.2 % (ref 11.5–15.5)
RDW: 13.2 % (ref 11.5–15.5)
WBC: 17.9 10*3/uL — ABNORMAL HIGH (ref 4.0–10.5)
WBC: 15.2 10*3/uL — ABNORMAL HIGH (ref 4.0–10.5)
nRBC: 0 % (ref 0.0–0.2)
nRBC: 0 % (ref 0.0–0.2)

## 2018-07-31 LAB — RUBELLA SCREEN: Rubella: <0.9 index — ABNORMAL LOW (ref >0.99)

## 2018-07-31 MED ORDER — OXYCODONE-ACETAMINOPHEN 5-325 MG PO TABS
2.0000 | ORAL_TABLET | ORAL | Status: DC | PRN
  Administered 2018-07-31: 2 via ORAL
  Filled 2018-07-31: qty 2

## 2018-07-31 MED ORDER — LACTATED RINGERS IV SOLN
INTRAVENOUS | Status: DC
  Administered 2018-07-31 (×2): via INTRAVENOUS

## 2018-07-31 MED ORDER — WITCH HAZEL-GLYCERIN EX PADS: 1.0000 "application " | MEDICATED_PAD | CUTANEOUS | Status: DC | PRN

## 2018-07-31 MED ORDER — SIMETHICONE 80 MG PO CHEW: 80.0000 mg | CHEWABLE_TABLET | ORAL | Status: DC | PRN

## 2018-07-31 MED ORDER — ACETAMINOPHEN 325 MG PO TABS: 650.0000 mg | ORAL_TABLET | ORAL | Status: DC | PRN

## 2018-07-31 MED ORDER — MEASLES, MUMPS & RUBELLA VAC IJ SOLR
0.5000 mL | Freq: Once | INTRAMUSCULAR | Status: AC
  Administered 2018-08-01: 0.5 mL via SUBCUTANEOUS
  Filled 2018-07-31: qty 0.5

## 2018-07-31 MED ORDER — ONDANSETRON HCL 4 MG/2ML IJ SOLN: 4.0000 mg | INTRAMUSCULAR | Status: DC | PRN

## 2018-07-31 MED ORDER — SENNOSIDES-DOCUSATE SODIUM 8.6-50 MG PO TABS
2.0000 | ORAL_TABLET | ORAL | Status: DC
  Administered 2018-07-31 – 2018-08-01 (×2): 2 via ORAL
  Filled 2018-07-31 (×2): qty 2

## 2018-07-31 MED ORDER — OXYCODONE-ACETAMINOPHEN 5-325 MG PO TABS: 1.0000 | ORAL_TABLET | ORAL | Status: DC | PRN

## 2018-07-31 MED ORDER — BENZOCAINE-MENTHOL 20-0.5 % EX AERO
1.0000 "application " | INHALATION_SPRAY | CUTANEOUS | Status: DC | PRN
  Administered 2018-07-31: 1 via TOPICAL
  Filled 2018-07-31: qty 56

## 2018-07-31 MED ORDER — COCONUT OIL OIL: 1.0000 "application " | TOPICAL_OIL | Status: DC | PRN

## 2018-07-31 MED ORDER — ZOLPIDEM TARTRATE 5 MG PO TABS: 5.0000 mg | ORAL_TABLET | Freq: Every evening | ORAL | Status: DC | PRN

## 2018-07-31 MED ORDER — TETANUS-DIPHTH-ACELL PERTUSSIS 5-2.5-18.5 LF-MCG/0.5 IM SUSP
0.5000 mL | Freq: Once | INTRAMUSCULAR | Status: AC
  Administered 2018-07-31: 0.5 mL via INTRAMUSCULAR
  Filled 2018-07-31: qty 0.5

## 2018-07-31 MED ORDER — ONDANSETRON HCL 4 MG PO TABS: 4.0000 mg | ORAL_TABLET | ORAL | Status: DC | PRN

## 2018-07-31 MED ORDER — IBUPROFEN 600 MG PO TABS
600.0000 mg | ORAL_TABLET | Freq: Four times a day (QID) | ORAL | Status: DC
  Administered 2018-07-31 – 2018-08-01 (×6): 600 mg via ORAL
  Filled 2018-07-31 (×6): qty 1

## 2018-07-31 MED ORDER — DIBUCAINE (PERIANAL) 1 % EX OINT: 1.0000 "application " | TOPICAL_OINTMENT | CUTANEOUS | Status: DC | PRN

## 2018-07-31 NOTE — Anesthesia Postprocedure Evaluation (Signed)
Anesthesia Post Note  Patient: Julia Nelson  Procedure(s) Performed: AN AD HOC LABOR EPIDURAL     Patient location during evaluation: OB High Risk Anesthesia Type: Epidural Level of consciousness: awake and alert Pain management: pain level controlled Vital Signs Assessment: post-procedure vital signs reviewed and stable Respiratory status: spontaneous breathing and nonlabored ventilation Cardiovascular status: blood pressure returned to baseline Postop Assessment: no headache, no backache and able to ambulate Anesthetic complications: no    Last Vitals:  Vitals:   07/31/18 1400 07/31/18 1500  BP:    Pulse:    Resp: 18 16  Temp:    SpO2:      Last Pain:  Vitals:   07/31/18 1145  TempSrc: Oral  PainSc:    Pain Goal: Patients Stated Pain Goal: 3 (07/31/18 0600)                 Sherrie George Travelle Mcclimans

## 2018-08-01 NOTE — Plan of Care (Signed)
Patient discharged.

## 2018-08-01 NOTE — Discharge Summary (Signed)
Obstetric Discharge Summary Reason for Admission: induction of labor and preeclampsia Prenatal Procedures: NST and Preeclampsia Intrapartum Procedures: spontaneous vaginal delivery Postpartum Procedures: Rubella Ig Complications-Operative and Postpartum: vaginal laceration Hemoglobin  Date Value Ref Range Status  07/31/2018 10.4 (L) 12.0 - 15.0 g/dL Final   HCT  Date Value Ref Range Status  07/31/2018 30.9 (L) 36.0 - 46.0 % Final    Discharge Diagnoses: Term Pregnancy-delivered  Discharge Information: Date: 08/01/2018 Activity: pelvic rest Diet: routine Medications: Ibuprofen Condition: stable Instructions: refer to practice specific booklet Discharge to: home Follow-up Information    Julia Aland, MD Follow up in 4 week(s).   Specialty:  Obstetrics and Gynecology Contact information: [address] [place] [address] [phone]           Newborn Data: Live born female  Birth Weight: 7 lb 6.3 oz (3354 g) APGAR: 9, 9  Newborn Delivery   Birth date/time:  07/30/2018 23:03:00 Delivery type:  Vaginal, Spontaneous     Home with mother.  Julia Nelson 08/01/2018, 9:29 AM

## 2018-08-01 NOTE — Progress Notes (Addendum)
Patient is eating, ambulating, voiding.  Pain control is good.  Vitals:   07/31/18 1540 07/31/18 1933 07/31/18 2329 08/01/18 0342  BP: (!) 107/52 (!) 113/56 132/68 116/60  Pulse: (!) 114 (!) 108 (!) 111 99  Resp: 17 17 17 18   Temp: 98.5 F (36.9 C) 98.1 F (36.7 C) 97.9 F (36.6 C) 97.8 F (36.6 C)  TempSrc: Oral Oral Oral Oral  SpO2: 99% 100% 100% 100%  Weight:      Height:        Fundus firm Perineum without swelling.  Results for orders placed or performed during the hospital encounter of 07/29/18 (from the past 72 hour(s))  Culture, OB Urine     Status: None   Collection Time: 07/29/18  3:30 PM  Result Value Ref Range   Specimen Description URINE, RANDOM    Special Requests NONE    Culture      NO GROWTH Performed at [facility], [address], [place], [place] [address]    Report Status 07/30/2018 FINAL   Urinalysis, Routine w reflex microscopic     Status: Abnormal   Collection Time: 07/29/18  3:32 PM  Result Value Ref Range   Color, Urine YELLOW YELLOW   APPearance HAZY (A) CLEAR   Specific Gravity, Urine 1.011 1.005 - 1.030   pH 6.0 5.0 - 8.0   Glucose, UA NEGATIVE NEGATIVE mg/dL   Hgb urine dipstick NEGATIVE NEGATIVE   Bilirubin Urine NEGATIVE NEGATIVE   Ketones, ur NEGATIVE NEGATIVE mg/dL   Protein, ur NEGATIVE NEGATIVE mg/dL   Nitrite NEGATIVE NEGATIVE   Leukocytes,Ua TRACE (A) NEGATIVE   RBC / HPF 0-5 0 - 5 RBC/hpf   WBC, UA 0-5 0 - 5 WBC/hpf   Bacteria, UA RARE (A) NONE SEEN   Squamous Epithelial / LPF 0-5 0 - 5    Comment: Performed at [facility], [address] [place], [place] [address]  Protein / creatinine ratio, urine     Status: Abnormal   Collection Time: 07/29/18  3:32 PM  Result Value Ref Range   Creatinine, Urine 43.40 mg/dL   Total Protein, Urine 15 mg/dL    Comment: NO NORMAL RANGE ESTABLISHED FOR THIS TEST   Protein Creatinine Ratio 0.35 (H) 0.00 - 0.15 mg/mg[Cre]    Comment: Performed at [facility] [address] [place], [place] [address]  CBC     Status: None   Collection Time: 07/29/18  4:18 PM  Result Value Ref Range   WBC 10.1 4.0 - 10.5 K/uL   RBC 4.54 3.87 - 5.11 MIL/uL   Hemoglobin 12.8 12.0 - 15.0 g/dL   HCT 39.2 36.0 - 46.0 %   MCV 86.3 80.0 - 100.0 fL   MCH 28.2 26.0 - 34.0 pg   MCHC 32.7 30.0 - 36.0 g/dL   RDW 12.8 11.5 - 15.5 %   Platelets 229 150 - 400 K/uL   nRBC 0.0 0.0 - 0.2 %    Comment: Performed at [facility], [place] [address], [place],  [address]  Comprehensive metabolic panel     Status: Abnormal   Collection Time: 07/29/18  4:18 PM  Result Value Ref Range   Sodium 135 135 - 145 mmol/L   Potassium 3.9 3.5 - 5.1 mmol/L   Chloride 103 98 - 111 mmol/L   CO2 19 (L) 22 - 32 mmol/L   Glucose, Bld 77 70 - 99 mg/dL   BUN 10 6 - 20  mg/dL   Creatinine, Ser 0.47 0.44 - 1.00 mg/dL   Calcium 9.4 8.9 - 10.3 mg/dL   Total Protein 6.1 (L) 6.5 - 8.1 g/dL   Albumin 2.7 (L) 3.5 - 5.0 g/dL   AST 24 15 - 41 U/L   ALT 18 0 - 44 U/L   Alkaline Phosphatase 222 (H) 38 - 126 U/L   Total Bilirubin 0.6 0.3 - 1.2 mg/dL   GFR calc non Af Amer >60 >60 mL/min   GFR calc Af Amer >60 >60 mL/min   Anion gap 13 5 - 15    Comment: Performed at [facility] [address] [place], [place] [address]  RPR     Status: None   Collection Time: 07/29/18  6:59 PM  Result Value Ref Range   RPR Ser Ql Non Reactive Non Reactive    Comment: (NOTE) Performed At: [facility] [address] [place], [place] [id number] Rush Farmer MD LK:[id number]   Type and screen [place]     Status: None   Collection Time: 07/29/18  7:51 PM  Result Value Ref Range   ABO/RH(D) A POS    Antibody Screen NEG    Sample Expiration      08/01/2018 Performed at [facility], [place] [address], [place], [place] [address]   ABO/Rh     Status: None   Collection Time: 07/29/18  7:51 PM  Result Value Ref Range   ABO/RH(D)      A POS Performed at [facility] [address] [place], [place] [address]   CBC     Status: Abnormal   Collection Time: 07/30/18 12:30 PM  Result Value Ref Range   WBC 11.8 (H) 4.0 - 10.5 K/uL   RBC 4.41 3.87 - 5.11 MIL/uL   Hemoglobin 12.8 12.0 - 15.0 g/dL   HCT 38.1 36.0 - 46.0 %   MCV 86.4 80.0 - 100.0 fL   MCH 29.0 26.0 - 34.0 pg   MCHC 33.6 30.0 - 36.0 g/dL   RDW 12.9 11.5 - 15.5 %   Platelets 213 150 - 400 K/uL   nRBC 0.0 0.0 - 0.2 %    Comment: Performed at [facility], [place] [address] [place], [place] [address]  CBC     Status: Abnormal   Collection Time: 07/31/18  1:48 AM  Result Value Ref Range   WBC 17.9 (H) 4.0 - 10.5 K/uL   RBC 3.98 3.87 - 5.11 MIL/uL   Hemoglobin 11.8 (L) 12.0 - 15.0 g/dL   HCT 34.7 (L) 36.0 - 46.0 %   MCV 87.2 80.0 - 100.0 fL   MCH 29.6 26.0 - 34.0 pg   MCHC 34.0 30.0 - 36.0 g/dL   RDW 13.2 11.5 - 15.5 %   Platelets 214 150 - 400 K/uL   nRBC 0.0 0.0 - 0.2 %    Comment: Performed at [facility], [place] [address], [place], [place] [address]  CBC     Status: Abnormal   Collection Time: 07/31/18  7:19 AM  Result Value Ref Range   WBC 15.2 (H) 4.0 - 10.5 K/uL   RBC 3.55 (L) 3.87 - 5.11 MIL/uL   Hemoglobin 10.4 (L) 12.0 - 15.0 g/dL   HCT 30.9 (L) 36.0 - 46.0 %   MCV 87.0 80.0 - 100.0 fL   MCH 29.3 26.0 - 34.0 pg   MCHC 33.7 30.0 - 36.0 g/dL   RDW 13.2  11.5 - 15.5 %   Platelets 194 150 - 400 K/uL   nRBC 0.0 0.0 - 0.2 %    Comment: Performed at [facility], [place] [address], [place], [place] [address]  Rubella screen     Status: Abnormal   Collection Time: 07/31/18  8:59 AM  Result Value Ref Range   Rubella <0.90 (L) Immune >0.99 index    Comment: (NOTE)                                Non-immune       <0.90                                Equivocal  0.90 - 0.99                                Immune           >0.99 Performed At: [facility] [place], [place] [id number] Rush Farmer MD PT:[id number]     --/--/A POS, A  POS Performed at [facility], [place] [address] [place], [place] [address]  (04/13 1953)/R NI  A/P Post partum day 2 severe preeclampsia s/p 24 hours of magnesium sulfate.  No symptoms of preeclampsia and BPS are stable.  Routine care.  Expect d/c routine.  MMR.  Daria Pastures

## 2018-08-01 NOTE — Progress Notes (Signed)
Late Entry from 07/31/2018 (just forgot to complete)  Patient was eating, ambulating, voiding.  Pain control was good.  No complaints, including no HA, vision change of RUQ pain.  Appropriate lochia.  Vitals:   07/31/18 1540 07/31/18 1933 07/31/18 2329 08/01/18 0342  BP: (!) 107/52 (!) 113/56 132/68 116/60  Pulse: (!) 114 (!) 108 (!) 111 99  Resp: 17 17 17 18   Temp: 98.5 F (36.9 C) 98.1 F (36.7 C) 97.9 F (36.6 C) 97.8 F (36.6 C)  TempSrc: Oral Oral Oral Oral  SpO2: 99% 100% 100% 100%  Weight:      Height:        Fundus firm, non tender Ext: no calf tenderness  Lab Results  Component Value Date   WBC 15.2 (H) 07/31/2018   HGB 10.4 (L) 07/31/2018   HCT 30.9 (L) 07/31/2018   MCV 87.0 07/31/2018   PLT 194 07/31/2018    --/--/A POS, A POS Performed at [facility], [address] [place], [place] [address]  [phone] 1953)  A/P Post partum day 1. Doing wel  Routine care.  Expect d/c 4/16.    Philip Aspen

## 2020-08-31 ENCOUNTER — Encounter (HOSPITAL_COMMUNITY): Payer: Self-pay | Admitting: Emergency Medicine

## 2020-08-31 ENCOUNTER — Emergency Department (HOSPITAL_COMMUNITY)
Admission: EM | Admit: 2020-08-31 | Discharge: 2020-09-01 | Disposition: A | Discharge status: Home / Self Care | Payer: 59 | Attending: Emergency Medicine | Admitting: Emergency Medicine

## 2020-08-31 ENCOUNTER — Emergency Department (HOSPITAL_COMMUNITY): Payer: 59

## 2020-08-31 ENCOUNTER — Other Ambulatory Visit: Payer: Self-pay

## 2020-08-31 DIAGNOSIS — R072 Precordial pain: Secondary | ICD-10-CM | POA: Diagnosis present

## 2020-08-31 DIAGNOSIS — R079 Chest pain, unspecified: Secondary | ICD-10-CM

## 2020-08-31 LAB — CBC
HCT: 45.1 % (ref 36.0–46.0)
Hemoglobin: 15.3 g/dL — ABNORMAL HIGH (ref 12.0–15.0)
MCH: 29.8 pg (ref 26.0–34.0)
MCHC: 33.9 g/dL (ref 30.0–36.0)
MCV: 87.9 fL (ref 80.0–100.0)
Platelets: 283 10*3/uL (ref 150–400)
RBC: 5.13 MIL/uL — ABNORMAL HIGH (ref 3.87–5.11)
RDW: 12.2 % (ref 11.5–15.5)
WBC: 9.9 10*3/uL (ref 4.0–10.5)
nRBC: 0 % (ref 0.0–0.2)

## 2020-08-31 LAB — I-STAT BETA HCG BLOOD, ED (MC, WL, AP ONLY): I-stat hCG, quantitative: <5 m[IU]/mL (ref <5)

## 2020-08-31 NOTE — ED Triage Notes (Signed)
Per ems, pt coming from work reporting chest pain worsening with deep breaths. Hx of anxiety. EMS VSS.

## 2020-08-31 NOTE — ED Provider Notes (Signed)
  Emergency Medicine Provider in Triage Note   MSE was initiated and I personally evaluated the patient  11:24 PM on Aug 31, 2020 as provider in triage.   Chief Complaint: chest pain  HPI  Patient is a 20 y.o. who presents to the ED with complaints of chest pain that began shortly PTA. Patient states she was at work with development of central chest discomfort, dyspnea, and lightheadedness. Feels shaky. No syncope. Had similar sxs when she was pregnant last time, is currently trying to get pregnant. Denies leg pain/swelling, hemoptysis, recent surgery/trauma, recent long travel, hormone use, personal hx of cancer, or hx of DVT/PE.     Review of Systems  Positive: Chest pain, dyspnea, lightheadedness, shakiness Negative: Leg pain/swelling, syncope  Physical Exam  BP (!) 138/96 (BP Location: Right Arm)   Pulse (!) 102   Temp 98.2 F (36.8 C) (Oral)   Resp 16   LMP 08/11/2020   SpO2 95%    Gen:   Awake   HEENT:  Atraumatic  Resp:  Normal effort  Cardiac:  Mild tachycardia.  Abd:   Nondistended, nontender  MSK:   Moves extremities without difficulty  Neuro:  Speech clear  Psych:  Somewhat anxious, tearful   Medical Decision Making   Initiation of care has begun. The patient has been counseled on the process, plan, and necessity for staying for the completion/evaluation, informed that the remainder of the evaluation will be completed by another provider, this initial triage assessment does not replace that evaluation, and the importance of remaining in the ED until their evaluation is complete.  Clinical Impression  Chest pain         Cherly Anderson, PA-C 08/31/20 2326    Horton, Clabe Seal, DO 08/31/20 2347

## 2020-09-01 DIAGNOSIS — R072 Precordial pain: Secondary | ICD-10-CM | POA: Diagnosis not present

## 2020-09-01 LAB — BASIC METABOLIC PANEL
Anion gap: 9 (ref 5–15)
BUN: 14 mg/dL (ref 6–20)
CO2: 23 mmol/L (ref 22–32)
Calcium: 9.6 mg/dL (ref 8.9–10.3)
Chloride: 105 mmol/L (ref 98–111)
Creatinine, Ser: 0.61 mg/dL (ref 0.44–1.00)
GFR, Estimated: >60 mL/min (ref >60)
Glucose, Bld: 116 mg/dL — ABNORMAL HIGH (ref 70–99)
Potassium: 3.6 mmol/L (ref 3.5–5.1)
Sodium: 137 mmol/L (ref 135–145)

## 2020-09-01 LAB — TROPONIN I (HIGH SENSITIVITY)
Troponin I (High Sensitivity): <2 ng/L (ref <18)
Troponin I (High Sensitivity): <2 ng/L (ref <18)

## 2020-09-01 NOTE — ED Provider Notes (Signed)
[facility] EMERGENCY DEPARTMENT Provider Note   CSN: [id number] Arrival date & time: 08/31/20  2312     History Chief Complaint  Patient presents with  . Chest Pain / Anxiety    Julia Nelson is a 20 y.o. female.  Patient had brief episode of chest pain while at work.  Felt a little lightheaded.  No shortness of breath.  No cough.  No fever.  The history is provided by the patient.  Chest Pain Pain location:  Substernal area Pain quality: aching   Pain radiates to:  Does not radiate Pain severity:  Mild Progression:  Resolved Chronicity:  New Context: at rest   Relieved by:  Nothing Worsened by:  Nothing Associated symptoms: no abdominal pain, no back pain, no cough, no fever, no palpitations, no shortness of breath and no vomiting   Risk factors: no coronary artery disease, no diabetes mellitus, no high cholesterol, no hypertension, no prior DVT/PE and no smoking        Past Medical History:  Diagnosis Date  . Medical history non-contributory     Patient Active Problem List   Diagnosis Date Noted  . Pregnancy 07/31/2018  . Preeclampsia, third trimester 07/29/2018  . Bacterial vaginosis 07/24/2018  . No leakage of amniotic fluid into vagina 06/23/2018  . Preterm contractions 06/23/2018    Past Surgical History:  Procedure Laterality Date  . CHOLECYSTECTOMY  2016  . CHOLECYSTECTOMY       OB History    Gravida  1   Para  1   Term  1   Preterm  0   AB  0   Living  1     SAB  0   IAB  0   Ectopic  0   Multiple  0   Live Births  1           Family History  Problem Relation Age of Onset  . Diabetes Mother        Type II    Social History   Tobacco Use  . Smoking status: Never Smoker  . Smokeless tobacco: Never Used  Vaping Use  . Vaping Use: Never used  Substance Use Topics  . Alcohol use: No  . Drug use: No    Home Medications Prior to Admission medications   Medication Sig Start Date End Date  Taking? Authorizing Provider  acetaminophen (TYLENOL) 500 MG tablet Take 1,000 mg by mouth every 6 (six) hours as needed.    [provider]  cyclobenzaprine (FLEXERIL) 5 MG tablet Take 1 tablet (5 mg total) by mouth 3 (three) times daily as needed for muscle spasms. 06/27/17   Willy Eddy, MD  fluticasone (FLONASE) 50 MCG/ACT nasal spray Place 2 sprays into both nostrils daily. 06/06/17   Cathie Hoops, Amy V, PA-C  NIFEdipine (PROCARDIA) 10 MG capsule Take 1 capsule (10 mg total) by mouth every 4 (four) hours as needed. 06/26/18   Thressa Sheller D, CNM  prenatal vitamin w/FE, FA (NATACHEW) 29-1 MG CHEW chewable tablet Chew 1 tablet by mouth daily at 12 noon.    [provider]  promethazine (PHENERGAN) 25 MG tablet Take 1 tablet (25 mg total) by mouth every 6 (six) hours as needed for nausea or vomiting. 12/29/17   Sharyon Cable, CNM    Allergies    Patient has no known allergies.  Review of Systems   Review of Systems  Constitutional: Negative for chills and fever.  HENT: Negative for ear  pain and sore throat.   Eyes: Negative for pain and visual disturbance.  Respiratory: Negative for cough and shortness of breath.   Cardiovascular: Positive for chest pain. Negative for palpitations.  Gastrointestinal: Negative for abdominal pain and vomiting.  Genitourinary: Negative for dysuria and hematuria.  Musculoskeletal: Negative for arthralgias and back pain.  Skin: Negative for color change and rash.  Neurological: Negative for seizures and syncope.  All other systems reviewed and are negative.   Physical Exam Updated Vital Signs BP 133/80 (BP Location: Left Arm)   Pulse 97   Temp 98.2 F (36.8 C) (Oral)   Resp 18   LMP 08/11/2020   SpO2 100%   Physical Exam Vitals and nursing note reviewed.  Constitutional:      General: She is not in acute distress.    Appearance: She is well-developed. She is not ill-appearing.  HENT:     Head: Normocephalic and atraumatic.      Nose: Nose normal.     Mouth/Throat:     Mouth: Mucous membranes are moist.  Eyes:     Extraocular Movements: Extraocular movements intact.     Conjunctiva/sclera: Conjunctivae normal.     Pupils: Pupils are equal, round, and reactive to light.  Cardiovascular:     Rate and Rhythm: Normal rate and regular rhythm.     Pulses: Normal pulses.     Heart sounds: Normal heart sounds. No murmur heard.   Pulmonary:     Effort: Pulmonary effort is normal. No respiratory distress.     Breath sounds: Normal breath sounds.  Abdominal:     General: Abdomen is flat.     Palpations: Abdomen is soft.     Tenderness: There is no abdominal tenderness.  Musculoskeletal:     Cervical back: Normal range of motion and neck supple.  Skin:    General: Skin is warm and dry.     Capillary Refill: Capillary refill takes less than 2 seconds.  Neurological:     General: No focal deficit present.     Mental Status: She is alert.  Psychiatric:        Mood and Affect: Mood normal.     ED Results / Procedures / Treatments   Labs (all labs ordered are listed, but only abnormal results are displayed) Labs Reviewed  CBC - Abnormal; Notable for the following components:      Result Value   RBC 5.13 (*)    Hemoglobin 15.3 (*)    All other components within normal limits  BASIC METABOLIC PANEL - Abnormal; Notable for the following components:   Glucose, Bld 116 (*)    All other components within normal limits  I-STAT BETA HCG BLOOD, ED ([facility], [facility], [facility] ONLY)  TROPONIN I (HIGH SENSITIVITY)  TROPONIN I (HIGH SENSITIVITY)    EKG EKG Interpretation  Date/Time:  Tuesday Aug 31 2020 23:26:02 EDT Ventricular Rate:  100 PR Interval:  126 QRS Duration: 88 QT Interval:  350 QTC Calculation: 451 R Axis:   11 Text Interpretation: Normal sinus rhythm Normal ECG Confirmed by Virgina Norfolk ([id number]) on 09/01/2020 6:09:57 AM   Radiology DG Chest 2 View  Result Date: 08/31/2020 CLINICAL DATA:  Chest pain EXAM:  CHEST - 2 VIEW COMPARISON:  None. FINDINGS: Cardiac shadow is within normal limits. Lungs are poorly aerated. Mild peribronchial cuffing is noted which may be related to a viral etiology. No bony abnormality is seen. No effusion is noted. IMPRESSION: Mild increased peribronchial markings which may be related to  a viral etiology. Electronically Signed   By: Alcide Clever M.D.   On: 08/31/2020 23:43    Procedures Procedures   Medications Ordered in ED Medications - No data to display  ED Course  I have reviewed the triage vital signs and the nursing notes.  Pertinent labs & imaging results that were available during my care of the patient were reviewed by me and considered in my medical decision making (see chart for details).    MDM Rules/Calculators/A&P                          Julia Nelson is here with chest pain.  Overall nonspecific story.  Not concerning for cardiac process.  Troponin negative x2.  EKG shows sinus rhythm.  Doubt ACS.  No PE risk factors.  No shortness of breath, no hypoxia, doubt PE, asymptomatic now.  No pneumonia, no pneumothorax.  Nonspecific increase in peribronchial markings but denies any cough or respiratory illness.  No significant anemia, electrolyte electrolyte, kidney injury.  Overall suspect muscular process or anxiety process.  Discharged in good condition.  Understands return  precautions.  This chart was dictated using voice recognition software.  Despite best efforts to proofread,  errors can occur which can change the documentation meaning.   Final Clinical Impression(s) / ED Diagnoses Final diagnoses:  Chest pain, unspecified type    Rx / DC Orders ED Discharge Orders    None       Virgina Norfolk, DO 09/01/20 [phone]

## 2020-09-01 NOTE — ED Notes (Signed)
Patient verbalizes understanding of discharge instructions. Opportunity for questioning and answers were provided. Armband removed by staff, pt discharged from ED ambulatory.   

## 2020-09-02 ENCOUNTER — Encounter: Payer: Self-pay | Admitting: Emergency Medicine

## 2020-09-02 ENCOUNTER — Ambulatory Visit
Admission: EM | Admit: 2020-09-02 | Discharge: 2020-09-02 | Disposition: A | Discharge status: Home / Self Care | Payer: 59 | Attending: Emergency Medicine | Admitting: Emergency Medicine

## 2020-09-02 ENCOUNTER — Other Ambulatory Visit: Payer: Self-pay

## 2020-09-02 DIAGNOSIS — B349 Viral infection, unspecified: Secondary | ICD-10-CM

## 2020-09-02 MED ORDER — ONDANSETRON 4 MG PO TBDP: 4.0000 mg | ORAL_TABLET | Freq: Three times a day (TID) | ORAL | 0 refills | Status: DC | PRN

## 2020-09-02 NOTE — Discharge Instructions (Addendum)
Take the antinausea medication as directed.    Keep yourself hydrated with clear liquids, such as water, Gatorade, Pedialyte, Sprite, or ginger ale.    Go to the emergency department if you have acute worsening symptoms.    Follow up with your primary care provider if your symptoms are not improving.      

## 2020-09-02 NOTE — ED Provider Notes (Signed)
Renaldo Fiddler    CSN: [id number] Arrival date & time: 09/02/20  1309      History   Chief Complaint Chief Complaint  Patient presents with  . Hypertension    HPI Julia Nelson is a 20 y.o. female.   Patient presents with concern for elevated blood pressure for 2 days.  She also reports nausea and headache.  She states she had vomiting and diarrhea yesterday but none today.  She treated her symptoms yesterday with Tylenol which relieved her headache.  She denies fever, chills, dizziness, weakness, chest pain, cough, shortness of breath, or other symptoms.  Patient was seen at [facility] ED on 08/31/2020; diagnosed with unspecified chest pain; discharged after negative work-up.  The history is provided by the patient and medical records.    Past Medical History:  Diagnosis Date  . Hypertension 2020  . Medical history non-contributory     Patient Active Problem List   Diagnosis Date Noted  . Pregnancy 07/31/2018  . Preeclampsia, third trimester 07/29/2018  . Bacterial vaginosis 07/24/2018  . No leakage of amniotic fluid into vagina 06/23/2018  . Preterm contractions 06/23/2018    Past Surgical History:  Procedure Laterality Date  . CHOLECYSTECTOMY  2016  . CHOLECYSTECTOMY      OB History    Gravida  1   Para  1   Term  1   Preterm  0   AB  0   Living  1     SAB  0   IAB  0   Ectopic  0   Multiple  0   Live Births  1            Home Medications    Prior to Admission medications   Medication Sig Start Date End Date Taking? Authorizing Provider  ondansetron (ZOFRAN ODT) 4 MG disintegrating tablet Take 1 tablet (4 mg total) by mouth every 8 (eight) hours as needed for nausea or vomiting. 09/02/20  Yes Mickie Bail, NP  acetaminophen (TYLENOL) 500 MG tablet Take 1,000 mg by mouth every 6 (six) hours as needed.    [provider]  cyclobenzaprine (FLEXERIL) 5 MG tablet Take 1 tablet (5 mg total) by mouth 3 (three) times daily as  needed for muscle spasms. 06/27/17   Willy Eddy, MD  fluticasone (FLONASE) 50 MCG/ACT nasal spray Place 2 sprays into both nostrils daily. 06/06/17   Cathie Hoops, Amy V, PA-C  NIFEdipine (PROCARDIA) 10 MG capsule Take 1 capsule (10 mg total) by mouth every 4 (four) hours as needed. 06/26/18   Thressa Sheller D, CNM  prenatal vitamin w/FE, FA (NATACHEW) 29-1 MG CHEW chewable tablet Chew 1 tablet by mouth daily at 12 noon.    [provider]  promethazine (PHENERGAN) 25 MG tablet Take 1 tablet (25 mg total) by mouth every 6 (six) hours as needed for nausea or vomiting. 12/29/17   Sharyon Cable, CNM    Family History Family History  Problem Relation Age of Onset  . Diabetes Mother        Type II    Social History Social History   Tobacco Use  . Smoking status: Never Smoker  . Smokeless tobacco: Never Used  Vaping Use  . Vaping Use: Never used  Substance Use Topics  . Alcohol use: No  . Drug use: No     Allergies   Patient has no known allergies.   Review of Systems Review of Systems  Constitutional: Negative for chills and  fever.  HENT: Negative for ear pain and sore throat.   Respiratory: Negative for cough and shortness of breath.   Cardiovascular: Negative for chest pain and palpitations.  Gastrointestinal: Positive for nausea. Negative for abdominal pain, diarrhea and vomiting.  Genitourinary: Negative for dysuria and hematuria.  Musculoskeletal: Negative for gait problem and neck pain.  Skin: Negative for color change and rash.  Neurological: Positive for headaches. Negative for dizziness, syncope and numbness.  All other systems reviewed and are negative.    Physical Exam Triage Vital Signs ED Triage Vitals [09/02/20 1320]  Enc Vitals Group     BP      Pulse      Resp      Temp      Temp src      SpO2      Weight      Height      Head Circumference      Peak Flow      Pain Score 6     Pain Loc      Pain Edu?      Excl. in GC?    No data  found.  Updated Vital Signs BP 126/85 (BP Location: Right Arm)   Pulse (!) 112   Temp 99.5 F (37.5 C) (Oral)   Resp 15   LMP 08/11/2020   SpO2 97%   Visual Acuity Right Eye Distance:   Left Eye Distance:   Bilateral Distance:    Right Eye Near:   Left Eye Near:    Bilateral Near:     Physical Exam Vitals and nursing note reviewed.  Constitutional:      General: She is not in acute distress.    Appearance: She is well-developed. She is obese. She is not ill-appearing.  HENT:     Head: Normocephalic and atraumatic.     Right Ear: Tympanic membrane normal.     Left Ear: Tympanic membrane normal.     Nose: Nose normal.     Mouth/Throat:     Mouth: Mucous membranes are moist.     Pharynx: Oropharynx is clear.  Eyes:     Conjunctiva/sclera: Conjunctivae normal.  Cardiovascular:     Rate and Rhythm: Normal rate and regular rhythm.     Heart sounds: Normal heart sounds.  Pulmonary:     Effort: Pulmonary effort is normal. No respiratory distress.     Breath sounds: Normal breath sounds.  Abdominal:     General: Bowel sounds are normal.     Palpations: Abdomen is soft.     Tenderness: There is no abdominal tenderness. There is no guarding or rebound.  Musculoskeletal:     Cervical back: Neck supple.  Skin:    General: Skin is warm and dry.  Neurological:     General: No focal deficit present.     Mental Status: She is alert and oriented to person, place, and time.     Gait: Gait normal.  Psychiatric:        Mood and Affect: Mood normal.        Behavior: Behavior normal.      UC Treatments / Results  Labs (all labs ordered are listed, but only abnormal results are displayed) Labs Reviewed - No data to display  EKG   Radiology DG Chest 2 View  Result Date: 08/31/2020 CLINICAL DATA:  Chest pain EXAM: CHEST - 2 VIEW COMPARISON:  None. FINDINGS: Cardiac shadow is within normal limits. Lungs are poorly aerated. Mild peribronchial cuffing  is noted which may be  related to a viral etiology. No bony abnormality is seen. No effusion is noted. IMPRESSION: Mild increased peribronchial markings which may be related to a viral etiology. Electronically Signed   By: Alcide Clever M.D.   On: 08/31/2020 23:43    Procedures Procedures (including critical care time)  Medications Ordered in UC Medications - No data to display  Initial Impression / Assessment and Plan / UC Course  I have reviewed the triage vital signs and the nursing notes.  Pertinent labs & imaging results that were available during my care of the patient were reviewed by me and considered in my medical decision making (see chart for details).    Viral illness.  Patient declines COVID or flu testing today.  Treating nausea with Zofran.  Instructed patient to keep her self hydrated with clear liquids.  Instructed her to continue taking Tylenol as needed, which has been giving good relief of her headache.  ED precautions discussed.  Instructed patient to follow-up with her PCP if her symptoms are not improving.  Work note provided per patient request.  She agrees to plan of care.  Final Clinical Impressions(s) / UC Diagnoses   Final diagnoses:  Viral illness     Discharge Instructions     Take the antinausea medication as directed.    Keep yourself hydrated with clear liquids, such as water, Gatorade, Pedialyte, Sprite, or ginger ale.    Go to the emergency department if you have acute worsening symptoms.    Follow up with your primary care provider if your symptoms are not improving.         ED Prescriptions    Medication Sig Dispense Auth. Provider   ondansetron (ZOFRAN ODT) 4 MG disintegrating tablet Take 1 tablet (4 mg total) by mouth every 8 (eight) hours as needed for nausea or vomiting. 20 tablet Mickie Bail, NP     PDMP not reviewed this encounter.   Mickie Bail, NP 09/02/20 1346

## 2020-09-02 NOTE — ED Triage Notes (Signed)
Patient c/o hypertension x 2 days.   Patient endorses a history of hypertension while pregnant.   Patient endorses having nausea yesterday and today.   Patient denies Chest Pain and SOB.   Patient endorses a headache.   Patient has an ED visit recently at [facility].   Patient has taken Tylenol yesterday night for headache with some relief of symptoms.

## 2020-11-29 LAB — OB RESULTS CONSOLE GC/CHLAMYDIA
Chlamydia: NEGATIVE
Gonorrhea: NEGATIVE

## 2020-12-30 LAB — OB RESULTS CONSOLE ABO/RH: RH Type: POSITIVE

## 2020-12-30 LAB — OB RESULTS CONSOLE HIV ANTIBODY (ROUTINE TESTING): HIV: NONREACTIVE

## 2020-12-30 LAB — OB RESULTS CONSOLE RPR: RPR: NONREACTIVE

## 2020-12-30 LAB — OB RESULTS CONSOLE RUBELLA ANTIBODY, IGM: Rubella: IMMUNE

## 2020-12-30 LAB — OB RESULTS CONSOLE HEPATITIS B SURFACE ANTIGEN: Hepatitis B Surface Ag: NEGATIVE

## 2020-12-30 LAB — HEPATITIS C ANTIBODY: HCV Ab: NEGATIVE

## 2020-12-30 LAB — OB RESULTS CONSOLE ANTIBODY SCREEN: Antibody Screen: NEGATIVE

## 2021-01-31 ENCOUNTER — Other Ambulatory Visit: Payer: Self-pay | Admitting: Obstetrics

## 2021-01-31 ENCOUNTER — Encounter: Payer: Self-pay | Admitting: Neurology

## 2021-01-31 ENCOUNTER — Other Ambulatory Visit (INDEPENDENT_AMBULATORY_CARE_PROVIDER_SITE_OTHER): Payer: Medicaid Other

## 2021-01-31 ENCOUNTER — Ambulatory Visit (INDEPENDENT_AMBULATORY_CARE_PROVIDER_SITE_OTHER): Payer: Medicaid Other | Admitting: Neurology

## 2021-01-31 ENCOUNTER — Other Ambulatory Visit: Payer: Self-pay

## 2021-01-31 VITALS — BP 126/82 | HR 97 | Ht 67.0 in | Wt 238.0 lb

## 2021-01-31 DIAGNOSIS — R253 Fasciculation: Secondary | ICD-10-CM

## 2021-01-31 DIAGNOSIS — R200 Anesthesia of skin: Secondary | ICD-10-CM | POA: Diagnosis not present

## 2021-01-31 DIAGNOSIS — Z363 Encounter for antenatal screening for malformations: Secondary | ICD-10-CM

## 2021-01-31 LAB — POTASSIUM: Potassium: 3.7 mEq/L (ref 3.5–5.1)

## 2021-01-31 LAB — MAGNESIUM: Magnesium: 1.8 mg/dL (ref 1.5–2.5)

## 2021-01-31 NOTE — Progress Notes (Signed)
NEUROLOGY CONSULTATION NOTE  BRIXTON SCHNAPP MRN: [id number] DOB: 07/11/2000  Referring provider: Dr. Marlow Baars Primary care provider: [facility]  Reason for consult:  thumb twitching, left hand numbness, syncope  Dear Dr Chestine Spore:  Thank you for your kind referral of Julia Nelson for consultation of the above symptoms. Although her history is well known to you, please allow me to reiterate it for the purpose of our medical record. She is alone in the office today.  Records and images were personally reviewed where available.   HISTORY OF PRESENT ILLNESS: This is a pleasant 20 year old right-handed woman with a history of preeclampsia, currently [redacted] weeks pregnant, presenting for evaluation of left hand numbness and twitching, and syncope. She also had a syncopal episode last 01/24/2021. She reports symptoms started 2 weeks ago, her left hand would go numb and her thumb would start shaking really bad. Very rarely, her left 5th digit would also twitch. They can last from a few minutes to all day. She states they occur when she gets really upset, she was having episodes daily then they have cut down to 1-2 in a week, last episode was 2 days ago. She would start noticing numbness in the left 4th and 5th digits, then her whole hand become involved, rarely going up her arm. Sometimes her right eye twitches and both legs feel like something is moving inside of her. There were 2 nights where she woke up with the feeling of movement in her legs with associated pain. There is no neck or arm pain. No associated confusion or speech difficulties. She brings a video where her left thumb is flexed at the joint with irregular twitching. She states she cannot feel her hand during this.   She lives with her parents who have not mentioned any staring/unresponsive episodes. She denies any  olfactory/gustatory hallucinations, deja vu, rising epigastric sensation. Her maternal  grandmother had seizures. Otherwise she had a normal birth and early development.  There is no history of febrile convulsions, CNS infections such as meningitis/encephalitis, significant traumatic brain injury, neurosurgical procedures.  She had a syncopal episode on 01/24/21 with no prior warning symptoms. 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 She has no recollection of events until Maintenance was knocking on her door and she was on the floor with a knot on her head. No tongue bite or incontinence. She had already urinated and pulled her pants up, she thinks it happened as she was walking out. Her computer time stamp noted she was gone for 20 minutes. She felt a little confused with a major headache after, but had no speech difficulties, no focal weakness. Headaches have resolved. She has rare dizziness. No diplopia, dysarthria/dysphagia, bowel/bladder dysfunction. She has not been sleeping well recently, she reports that 2 weeks ago, she discovered her fiance was sleeping with her sister, and "all this started."    PAST MEDICAL HISTORY: Past Medical History:  Diagnosis Date   Hypertension 2020   Medical history non-contributory     PAST SURGICAL HISTORY: Past Surgical History:  Procedure Laterality Date   CHOLECYSTECTOMY  2016   CHOLECYSTECTOMY      MEDICATIONS: Current Outpatient Medications on File Prior to Visit  Medication Sig Dispense Refill   acetaminophen (TYLENOL) 500 MG tablet Take 1,000 mg by mouth every 6 (six) hours as needed.     BABY ASPIRIN PO Take by mouth.  prenatal vitamin w/FE, FA (NATACHEW) 29-1 MG CHEW chewable tablet Chew 1 tablet by mouth daily at 12 noon.     No current facility-administered medications on file prior to visit.    ALLERGIES: No Known Allergies  FAMILY HISTORY: Family History  Problem Relation Age of Onset   Diabetes Mother        Type II    SOCIAL HISTORY: Social History   Socioeconomic History    Marital status: Single    Spouse name: Not on file   Number of children: 1   Years of education: Not on file   Highest education level: Not on file  Occupational History   Not on file  Tobacco Use   Smoking status: Never   Smokeless tobacco: Never  Vaping Use   Vaping Use: Never used  Substance and Sexual Activity   Alcohol use: No   Drug use: No   Sexual activity: Yes  Other Topics Concern   Not on file  Social History Narrative   Patient is [redacted] weeks pregnant.    Right Handed    Lives in two story home   Drinks some caffeine   Social Determinants of Health   Financial Resource Strain: Not on file  Food Insecurity: Not on file  Transportation Needs: Not on file  Physical Activity: Not on file  Stress: Not on file  Social Connections: Not on file  Intimate Partner Violence: Not on file     PHYSICAL EXAM: Vitals:   01/31/21 0856  BP: 126/82  Pulse: 97  SpO2: 96%   General: No acute distress Head:  Normocephalic/atraumatic Skin/Extremities: No rash, no edema Neurological Exam: Mental status: alert and oriented to person, place, and time, no dysarthria or aphasia, Fund of knowledge is appropriate.  Recent and remote memory are intact.  Attention and concentration are normal.    Cranial nerves: CN I: not tested CN II: pupils equal, round and reactive to light, visual fields intact CN III, IV, VI:  full range of motion, no nystagmus, no ptosis CN V: facial sensation intact CN VII: upper and lower face symmetric CN VIII: hearing intact to conversation Bulk & Tone: normal, no fasciculations. Motor: 5/5 throughout except for 4/5 left APB.  Sensation: decreased cold on left 5th digit, decreased pin on left 5th and medial 4th digit. Intact to all modalities on both LE. Romberg test negative Deep Tendon Reflexes: +2 throughout, no ankle clonus, negative Hoffman sign Cerebellar: no incoordination on finger to nose testing Gait: narrow-based and steady, able to tandem  walk adequately. Tremor: none Negative Tinel sign at the wrist and elbow bilaterally.   IMPRESSION: This is a pleasant 20 year old right-handed woman with a history of preeclampsia, currently [redacted] weeks pregnant, presenting for evaluation of left hand numbness and twitching, and syncope. Her neurological exam shows decreased sensation in the left ulnar distribution. The semiology of left thumb twitching is not consistent with seizure. MRI brain without contrast and EMG/NCV of the left upper extremity will be ordered to further evaluate her symptoms. Check magnesium and calcium levels. 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 Syncopal episode likely vasovagal. Our office will call with results, follow-up as needed.    Thank you for allowing me to participate in the care of this patient. Please do not hesitate to call for any questions or concerns.   [name], M.D.  CC: Dr. Chestine Spore

## 2021-01-31 NOTE — Patient Instructions (Addendum)
Good to meet you!  Bloodwork for calcium, magnesium levels  2. Schedule MRI brain without contrast  3. Schedule EMG/NCV of left upper extremity  4. Our office will call your with results and follow-up as needed  We have sent a referral to [facility] for your MRI and they will call you directly to schedule your appointment. They are located at [address] [facility]. If you need to contact them directly please call [phone].   Your provider has requested that you have labwork completed today. Please go to [facility] ([id number]) on the second floor of this building before leaving the office today. You do not need to check in. If you are not called within 15 minutes please check with the front desk.

## 2021-02-09 ENCOUNTER — Telehealth: Payer: Self-pay

## 2021-02-09 NOTE — Telephone Encounter (Signed)
-----   Message from Van Clines, MD sent at 02/08/2021  3:38 PM EDT ----- Pls let her know bloodwork was normal, proceed with other tests as scheduled, thanks

## 2021-02-09 NOTE — Telephone Encounter (Signed)
Pt called no answer left a voice mail per DRP bloodwork was normal, proceed with other tests as scheduled

## 2021-02-22 ENCOUNTER — Encounter: Payer: Self-pay | Admitting: *Deleted

## 2021-02-28 ENCOUNTER — Other Ambulatory Visit: Payer: Self-pay | Admitting: *Deleted

## 2021-02-28 ENCOUNTER — Encounter: Payer: Self-pay | Admitting: *Deleted

## 2021-02-28 ENCOUNTER — Ambulatory Visit: Payer: Medicaid Other | Attending: Obstetrics

## 2021-02-28 ENCOUNTER — Ambulatory Visit: Payer: Medicaid Other | Admitting: *Deleted

## 2021-02-28 ENCOUNTER — Other Ambulatory Visit: Payer: Self-pay

## 2021-02-28 VITALS — BP 134/81 | HR 96

## 2021-02-28 DIAGNOSIS — Z362 Encounter for other antenatal screening follow-up: Secondary | ICD-10-CM

## 2021-02-28 DIAGNOSIS — O09292 Supervision of pregnancy with other poor reproductive or obstetric history, second trimester: Secondary | ICD-10-CM | POA: Diagnosis present

## 2021-02-28 DIAGNOSIS — Z363 Encounter for antenatal screening for malformations: Secondary | ICD-10-CM | POA: Insufficient documentation

## 2021-02-28 DIAGNOSIS — Z6836 Body mass index (BMI) 36.0-36.9, adult: Secondary | ICD-10-CM

## 2021-03-01 ENCOUNTER — Ambulatory Visit
Admission: RE | Admit: 2021-03-01 | Discharge: 2021-03-01 | Disposition: A | Discharge status: Home / Self Care | Payer: Medicaid Other | Source: Ambulatory Visit | Attending: Neurology | Admitting: Neurology

## 2021-03-01 ENCOUNTER — Ambulatory Visit (INDEPENDENT_AMBULATORY_CARE_PROVIDER_SITE_OTHER): Payer: Medicaid Other | Admitting: Neurology

## 2021-03-01 DIAGNOSIS — G5622 Lesion of ulnar nerve, left upper limb: Secondary | ICD-10-CM

## 2021-03-01 DIAGNOSIS — R200 Anesthesia of skin: Secondary | ICD-10-CM | POA: Diagnosis not present

## 2021-03-01 NOTE — Procedures (Signed)
[facility]  [address] [place], [address]  [place], [place] [address] Tel: [phone] Fax:  [phone] Test Date:  03/01/2021  Patient: Julia Nelson DOB: Nov 04, 2000 Physician: Nita Sickle, DO  Sex: Female Height: 5\' 7"  Ref Phys: , M.D.  ID#: [name]   Technician:    Patient Complaints: This is a 20 year old female referred for evaluation of left hand paresthesias.  NCV & EMG Findings: Extensive electrodiagnostic testing of the left upper extremity shows:  Left median, ulnar, and mixed palmar sensory responses are within normal limits. Left median motor responses within normal limits.  Left ulnar motor response shows reduced amplitude (7.9 mV) and decreased conduction velocity (A Elbow-B Elbow, 45 m/s).   Chronic motor axonal loss changes are seen affecting the left first dorsal interosseous and abductor digiti minimi muscles.    Impression: Left ulnar neuropathy with slowing across the elbow, with demyelinating and axonal features.  Overall, these findings are moderate in degree electrically.   ___________________________ 21, DO    Nerve Conduction Studies Anti Sensory Summary Table   Stim Site NR Peak (ms) Norm Peak (ms) P-T Amp (V) Norm P-T Amp  Left Median Anti Sensory (2nd Digit)  35C  Wrist    2.5 <3.3 77.7 >20  Left Ulnar Anti Sensory (5th Digit)  35C  Wrist    2.3 <3.0 32.8 >18   Motor Summary Table   Stim Site NR Onset (ms) Norm Onset (ms) O-P Amp (mV) Norm O-P Amp Site1 Site2 Delta-0 (ms) Dist (cm) Vel (m/s) Norm Vel (m/s)  Left Median Motor (Abd Poll Brev)  35C  Wrist    2.4 <3.9 14.3 >6 Elbow Wrist 4.8 28.0 58 >51  Elbow    7.2  13.8         Left Ulnar Motor (Abd Dig Minimi)  35C  Wrist    2.0 <3.0 7.9 >8 B Elbow Wrist 3.8 20.0 53 >51  B Elbow    5.8  7.5  A Elbow B Elbow 2.2 10.0 45 >51  A Elbow    8.0  6.8          Comparison Summary Table   Stim Site NR Peak (ms) Norm Peak (ms) P-T Amp (V) Site1 Site2  Delta-P (ms) Norm Delta (ms)  Left Median/Ulnar Palm Comparison (Wrist - 8cm)  35C  Median Palm    1.6 <2.2 74.9 Median Palm Ulnar Palm 0.3   Ulnar Palm    1.3 <2.2 15.1       EMG   Side Muscle Ins Act Fibs Psw Fasc Number Recrt Dur Dur. Amp Amp. Poly Poly. Comment  Left 1stDorInt Nml Nml Nml Nml 1- Rapid Few 1+ Few 1+ Few 1+ N/A  Left ABD Dig Min Nml Nml Nml Nml 1- Rapid Some 1+ Some 1+ Some 1+ N/A  Left PronatorTeres Nml Nml Nml Nml Nml Nml Nml Nml Nml Nml Nml Nml N/A  Left Triceps Nml Nml Nml Nml Nml Nml Nml Nml Nml Nml Nml Nml N/A  Left Deltoid Nml Nml Nml Nml Nml Nml Nml Nml Nml Nml Nml Nml N/A  Left Biceps Nml Nml Nml Nml Nml Nml Nml Nml Nml Nml Nml Nml N/A  Left FlexCarpiUln Nml Nml Nml Nml Nml Nml Nml Nml Nml Nml Nml Nml N/A      Waveforms:

## 2021-03-04 ENCOUNTER — Telehealth: Payer: Self-pay

## 2021-03-04 DIAGNOSIS — G589 Mononeuropathy, unspecified: Secondary | ICD-10-CM

## 2021-03-04 NOTE — Telephone Encounter (Signed)
Pt called an informed that the nerve test showed a pinched nerve at the left elbow. If it is still bothering her a lot, we can refer her to the hand surgeon.

## 2021-03-04 NOTE — Telephone Encounter (Signed)
-----   Message from Van Clines, MD sent at 03/04/2021 12:25 PM EST ----- Pls let her know brain MRI was normal, no tumor, stroke, or bleed. Thanks

## 2021-03-04 NOTE — Telephone Encounter (Signed)
-----   Message from Van Clines, MD sent at 03/04/2021 12:23 PM EST ----- Pls let her know the nerve test showed a pinched nerve at the left elbow. If it is still bothering her a lot, we can refer her to the hand surgeon. Thanks

## 2021-03-04 NOTE — Telephone Encounter (Signed)
Pt called an informed that MRI of the brain was normal, no tumor, stroke, or bleed

## 2021-03-21 ENCOUNTER — Other Ambulatory Visit: Payer: Self-pay

## 2021-03-21 ENCOUNTER — Inpatient Hospital Stay (HOSPITAL_COMMUNITY)
Admission: AD | Admit: 2021-03-21 | Discharge: 2021-03-21 | Disposition: A | Discharge status: Home / Self Care | Payer: Medicaid Other | Attending: Obstetrics and Gynecology | Admitting: Obstetrics and Gynecology

## 2021-03-21 ENCOUNTER — Encounter (HOSPITAL_COMMUNITY): Payer: Self-pay | Admitting: Obstetrics and Gynecology

## 2021-03-21 DIAGNOSIS — M545 Low back pain, unspecified: Secondary | ICD-10-CM | POA: Insufficient documentation

## 2021-03-21 DIAGNOSIS — R519 Headache, unspecified: Secondary | ICD-10-CM | POA: Insufficient documentation

## 2021-03-21 DIAGNOSIS — Z3A23 23 weeks gestation of pregnancy: Secondary | ICD-10-CM | POA: Diagnosis not present

## 2021-03-21 DIAGNOSIS — O99891 Other specified diseases and conditions complicating pregnancy: Secondary | ICD-10-CM | POA: Insufficient documentation

## 2021-03-21 DIAGNOSIS — R030 Elevated blood-pressure reading, without diagnosis of hypertension: Secondary | ICD-10-CM | POA: Diagnosis not present

## 2021-03-21 DIAGNOSIS — O368120 Decreased fetal movements, second trimester, not applicable or unspecified: Secondary | ICD-10-CM | POA: Diagnosis present

## 2021-03-21 DIAGNOSIS — O26892 Other specified pregnancy related conditions, second trimester: Secondary | ICD-10-CM

## 2021-03-21 DIAGNOSIS — O09892 Supervision of other high risk pregnancies, second trimester: Secondary | ICD-10-CM | POA: Insufficient documentation

## 2021-03-21 DIAGNOSIS — M549 Dorsalgia, unspecified: Secondary | ICD-10-CM | POA: Diagnosis not present

## 2021-03-21 LAB — PROTEIN / CREATININE RATIO, URINE
Creatinine, Urine: 150.53 mg/dL
Protein Creatinine Ratio: 0.21 mg/mg{Cre} — ABNORMAL HIGH (ref 0.00–0.15)
Total Protein, Urine: 32 mg/dL

## 2021-03-21 LAB — URINALYSIS, ROUTINE W REFLEX MICROSCOPIC
Bilirubin Urine: NEGATIVE
Glucose, UA: 250 mg/dL — AB
Hgb urine dipstick: NEGATIVE
Ketones, ur: NEGATIVE mg/dL
Leukocytes,Ua: NEGATIVE
Nitrite: NEGATIVE
Protein, ur: 30 mg/dL — AB
Specific Gravity, Urine: >1.03 — ABNORMAL HIGH (ref 1.005–1.030)
pH: 6 (ref 5.0–8.0)

## 2021-03-21 LAB — CBC WITH DIFFERENTIAL/PLATELET
Abs Immature Granulocytes: 0.07 10*3/uL (ref 0.00–0.07)
Basophils Absolute: 0 10*3/uL (ref 0.0–0.1)
Basophils Relative: 0 %
Eosinophils Absolute: 0.1 10*3/uL (ref 0.0–0.5)
Eosinophils Relative: 1 %
HCT: 39.8 % (ref 36.0–46.0)
Hemoglobin: 13.6 g/dL (ref 12.0–15.0)
Immature Granulocytes: 1 %
Lymphocytes Relative: 27 %
Lymphs Abs: 2.8 10*3/uL (ref 0.7–4.0)
MCH: 30 pg (ref 26.0–34.0)
MCHC: 34.2 g/dL (ref 30.0–36.0)
MCV: 87.9 fL (ref 80.0–100.0)
Monocytes Absolute: 0.6 10*3/uL (ref 0.1–1.0)
Monocytes Relative: 6 %
Neutro Abs: 6.9 10*3/uL (ref 1.7–7.7)
Neutrophils Relative %: 65 %
Platelets: 225 10*3/uL (ref 150–400)
RBC: 4.53 MIL/uL (ref 3.87–5.11)
RDW: 12.5 % (ref 11.5–15.5)
WBC: 10.5 10*3/uL (ref 4.0–10.5)
nRBC: 0 % (ref 0.0–0.2)

## 2021-03-21 LAB — COMPREHENSIVE METABOLIC PANEL
ALT: 15 U/L (ref 0–44)
AST: 15 U/L (ref 15–41)
Albumin: 3 g/dL — ABNORMAL LOW (ref 3.5–5.0)
Alkaline Phosphatase: 79 U/L (ref 38–126)
Anion gap: 8 (ref 5–15)
BUN: 5 mg/dL — ABNORMAL LOW (ref 6–20)
CO2: 21 mmol/L — ABNORMAL LOW (ref 22–32)
Calcium: 9.1 mg/dL (ref 8.9–10.3)
Chloride: 105 mmol/L (ref 98–111)
Creatinine, Ser: 0.4 mg/dL — ABNORMAL LOW (ref 0.44–1.00)
GFR, Estimated: >60 mL/min (ref >60)
Glucose, Bld: 109 mg/dL — ABNORMAL HIGH (ref 70–99)
Potassium: 3.6 mmol/L (ref 3.5–5.1)
Sodium: 134 mmol/L — ABNORMAL LOW (ref 135–145)
Total Bilirubin: 0.2 mg/dL — ABNORMAL LOW (ref 0.3–1.2)
Total Protein: 6.2 g/dL — ABNORMAL LOW (ref 6.5–8.1)

## 2021-03-21 LAB — URINALYSIS, MICROSCOPIC (REFLEX): RBC / HPF: NONE SEEN RBC/hpf (ref 0–5)

## 2021-03-21 MED ORDER — ACETAMINOPHEN 500 MG PO TABS
1000.0000 mg | ORAL_TABLET | Freq: Once | ORAL | Status: AC
  Administered 2021-03-21: 1000 mg via ORAL
  Filled 2021-03-21: qty 2

## 2021-03-21 MED ORDER — METOCLOPRAMIDE HCL 10 MG PO TABS
10.0000 mg | ORAL_TABLET | Freq: Once | ORAL | Status: AC
  Administered 2021-03-21: 10 mg via ORAL
  Filled 2021-03-21: qty 1

## 2021-03-21 NOTE — MAU Note (Signed)
Hasn't felt any movement since Saturday, called dr, was told to come in. Has since felt movement once.  Having low back pian, started this morning.  Has a HA, has not taken anything for it, having some blurred vision.

## 2021-03-21 NOTE — MAU Provider Note (Signed)
History     CSN: [id number]  Arrival date and time: 03/21/21 1633   Event Date/Time   First Provider Initiated Contact with Patient 03/21/21 1737      No chief complaint on file.  HPI  Ms.Julia Nelson is a 20 y.o. female G2P1001 @ [redacted]w[redacted]d here in MAU with complaints of DFM and bilateral back pain.  She called the office today to report DFM and lower back pain and was instructed to come in.  The pain is located on both sides of her lower back. The pain started this morning; first thing when she woke. She rates her pain 8/10 in her lower back.  She has a history of pre E with her previous pregnancy, no history of CHTN.  Hx of She was induced at 36w6 days and had a vaginla delivery with previous.   She reports new onset of vision changes and HA today. She reports her vision is fine and then all of a sudden goes blurry. She reports her headache as a 6/10 and is located in her forehead and top of her head.   + fetal movement upon arrival.   OB History     Gravida  2   Para  1   Term  1   Preterm  0   AB  0   Living  1      SAB  0   IAB  0   Ectopic  0   Multiple  0   Live Births  1           Past Medical History:  Diagnosis Date   Hypertension 2020   Medical history non-contributory     Past Surgical History:  Procedure Laterality Date   CHOLECYSTECTOMY  2016   CHOLECYSTECTOMY      Family History  Problem Relation Age of Onset   Diabetes Mother        Type II    Social History   Tobacco Use   Smoking status: Never   Smokeless tobacco: Never  Vaping Use   Vaping Use: Never used  Substance Use Topics   Alcohol use: No   Drug use: No    Allergies: No Known Allergies  Medications Prior to Admission  Medication Sig Dispense Refill Last Dose   BABY ASPIRIN PO Take by mouth.   03/21/2021   prenatal vitamin w/FE, FA (NATACHEW) 29-1 MG CHEW chewable tablet Chew 1 tablet by mouth daily at 12 noon.   03/21/2021   acetaminophen (TYLENOL) 500 MG  tablet Take 1,000 mg by mouth every 6 (six) hours as needed.      Results for orders placed or performed during the hospital encounter of 03/21/21 (from the past 48 hour(s))  Urinalysis, Routine w reflex microscopic Urine, Clean Catch     Status: Abnormal   Collection Time: 03/21/21  4:53 PM  Result Value Ref Range   Color, Urine YELLOW YELLOW   APPearance HAZY (A) CLEAR   Specific Gravity, Urine >1.030 (H) 1.005 - 1.030   pH 6.0 5.0 - 8.0   Glucose, UA 250 (A) NEGATIVE mg/dL   Hgb urine dipstick NEGATIVE NEGATIVE   Bilirubin Urine NEGATIVE NEGATIVE   Ketones, ur NEGATIVE NEGATIVE mg/dL   Protein, ur 30 (A) NEGATIVE mg/dL   Nitrite NEGATIVE NEGATIVE   Leukocytes,Ua NEGATIVE NEGATIVE    Comment: Performed at [facility], [address], [place], [place] [address]  Urinalysis, Microscopic (reflex)     Status: Abnormal  Collection Time: 03/21/21  4:53 PM  Result Value Ref Range   RBC / HPF NONE SEEN 0 - 5 RBC/hpf   WBC, UA 0-5 0 - 5 WBC/hpf   Bacteria, UA FEW (A) NONE SEEN   Squamous Epithelial / LPF 0-5 0 - 5   Mucus PRESENT    Ca Oxalate Crys, UA PRESENT     Comment: Performed at [facility], [address] [place], [place] [address]  Protein / creatinine ratio, urine     Status: Abnormal   Collection Time: 03/21/21  5:42 PM  Result Value Ref Range   Creatinine, Urine 150.53 mg/dL   Total Protein, Urine 32 mg/dL    Comment: NO NORMAL RANGE ESTABLISHED FOR THIS TEST   Protein Creatinine Ratio 0.21 (H) 0.00 - 0.15 mg/mg[Cre]    Comment: Performed at [facility], [address] [place], [place] [address]  CBC with Differential/Platelet     Status: None   Collection Time: 03/21/21  6:39 PM  Result Value Ref Range   WBC 10.5 4.0 - 10.5 K/uL   RBC 4.53 3.87 - 5.11 MIL/uL   Hemoglobin 13.6 12.0 - 15.0 g/dL   HCT 32.4 40.1 - 02.7 %   MCV 87.9 80.0 - 100.0 fL   MCH 30.0 26.0 - 34.0 pg   MCHC 34.2 30.0 - 36.0 g/dL   RDW 25.3 66.4 - 40.3 %   Platelets 225  150 - 400 K/uL   nRBC 0.0 0.0 - 0.2 %   Neutrophils Relative % 65 %   Neutro Abs 6.9 1.7 - 7.7 K/uL   Lymphocytes Relative 27 %   Lymphs Abs 2.8 0.7 - 4.0 K/uL   Monocytes Relative 6 %   Monocytes Absolute 0.6 0.1 - 1.0 K/uL   Eosinophils Relative 1 %   Eosinophils Absolute 0.1 0.0 - 0.5 K/uL   Basophils Relative 0 %   Basophils Absolute 0.0 0.0 - 0.1 K/uL   Immature Granulocytes 1 %   Abs Immature Granulocytes 0.07 0.00 - 0.07 K/uL    Comment: Performed at [facility], [address], [place], [place] [address]  Comprehensive metabolic panel     Status: Abnormal   Collection Time: 03/21/21  6:39 PM  Result Value Ref Range   Sodium 134 (L) 135 - 145 mmol/L   Potassium 3.6 3.5 - 5.1 mmol/L   Chloride 105 98 - 111 mmol/L   CO2 21 (L) 22 - 32 mmol/L   Glucose, Bld 109 (H) 70 - 99 mg/dL    Comment: Glucose reference range applies only to samples taken after fasting for at least 8 hours.   BUN 5 (L) 6 - 20 mg/dL   Creatinine, Ser 9.56 (L) 0.44 - 1.00 mg/dL   Calcium 9.1 8.9 - 38.7 mg/dL   Total Protein 6.2 (L) 6.5 - 8.1 g/dL   Albumin 3.0 (L) 3.5 - 5.0 g/dL   AST 15 15 - 41 U/L   ALT 15 0 - 44 U/L   Alkaline Phosphatase 79 38 - 126 U/L   Total Bilirubin 0.2 (L) 0.3 - 1.2 mg/dL   GFR, Estimated >56 >43 mL/min    Comment: (NOTE) Calculated using the CKD-EPI Creatinine Equation (2021)    Anion gap 8 5 - 15    Comment: Performed at [facility], [address] [place], [place] [address]     Review of Systems  Constitutional:  Negative for fever.  Eyes:  Negative for photophobia.  Gastrointestinal:  Negative for abdominal pain.  Genitourinary:  Negative for dysuria and urgency.  Neurological:  Positive for headaches.  Physical Exam   Blood pressure (!) 145/89, pulse (!) 111, temperature 98.6 F (37 C), temperature source Oral, resp. rate 18, last menstrual period 09/25/2020, SpO2 98 %, unknown if currently breastfeeding. Patient Vitals for the past 24 hrs:  BP  Temp Temp src Pulse Resp SpO2  03/21/21 1930 -- -- -- -- -- 99 %  03/21/21 1925 -- -- -- -- -- 99 %  03/21/21 1920 -- -- -- -- -- 98 %  03/21/21 1916 131/78 -- -- (!) 103 -- --  03/21/21 1915 -- -- -- -- -- 98 %  03/21/21 1910 -- -- -- -- -- 98 %  03/21/21 1905 -- -- -- -- -- 98 %  03/21/21 1901 131/76 -- -- (!) 101 -- --  03/21/21 1900 -- -- -- -- -- 98 %  03/21/21 1855 -- -- -- -- -- 98 %  03/21/21 1850 -- -- -- -- -- 99 %  03/21/21 1849 134/88 -- -- (!) 107 -- --  03/21/21 1810 -- -- -- -- -- 98 %  03/21/21 1805 -- -- -- -- -- 98 %  03/21/21 1801 (!) 133/91 -- -- (!) 113 -- --  03/21/21 1800 -- -- -- -- -- 98 %  03/21/21 1758 131/75 -- -- (!) 113 -- --  03/21/21 1755 -- -- -- -- -- 98 %  03/21/21 1750 -- -- -- -- -- 99 %  03/21/21 1746 (!) 148/85 -- -- (!) 108 -- --  03/21/21 1745 -- -- -- -- -- 99 %  03/21/21 1740 -- -- -- -- -- 99 %  03/21/21 1735 -- -- -- -- -- 98 %  03/21/21 1731 (!) 147/89 -- -- (!) 106 -- --  03/21/21 1730 -- -- -- -- -- 99 %  03/21/21 1725 -- -- -- -- -- 98 %  03/21/21 1720 -- -- -- -- -- 98 %  03/21/21 1716 (!) 145/89 -- -- (!) 111 -- --  03/21/21 1715 -- -- -- -- -- 98 %  03/21/21 1710 -- -- -- -- -- 98 %  03/21/21 1709 (!) 142/84 -- -- (!) 107 -- --  03/21/21 1708 (!) 142/84 -- -- (!) 105 -- --  03/21/21 1705 -- -- -- -- -- 98 %  03/21/21 1648 136/84 98.6 F (37 C) Oral (!) 113 18 99 %    Physical Exam Vitals and nursing note reviewed.  Constitutional:      General: She is not in acute distress.    Appearance: Normal appearance. She is not ill-appearing, toxic-appearing or diaphoretic.  HENT:     Head: Normocephalic.  Pulmonary:     Effort: Pulmonary effort is normal.  Abdominal:     Tenderness: There is no right CVA tenderness or left CVA tenderness.  Musculoskeletal:       Arms:  Skin:    General: Skin is warm.  Neurological:     Mental Status: She is alert and oriented to person, place, and time.     Deep Tendon Reflexes:  Reflexes normal.     Comments: Negative clonus   Psychiatric:        Behavior: Behavior normal.   Fetal Tracing: Baseline: 140 bpm Variability: Moderate  Accelerations: 15x15 Decelerations: None Toco: None  MAU Course  Procedures  MDM  PIH labs reassuring.  Bps non severe range and labile Tylenol 1 gram given PO, HA now  0/10 No blurred vision while in MAU Discussed follow up with Dr. Timothy Lasso who will arrange for the office to call her for BP check this week.  Urine culture pending.   Assessment and Plan   A:  1. Elevated BP without diagnosis of hypertension   2. [redacted] weeks gestation of pregnancy   3. Back pain in pregnancy   4. Headache in pregnancy, antepartum, second trimester      P:  Discharge home in stable condition Pre E precautions BP check in the office Return to MAU if symptoms worsen Ok to use tylenol as directed on the bottle  Venia Carbon I, NP 03/21/2021 9:08 PM

## 2021-03-23 LAB — CULTURE, OB URINE: Culture: >=100000 — AB

## 2021-03-25 ENCOUNTER — Other Ambulatory Visit: Payer: Self-pay | Admitting: Obstetrics and Gynecology

## 2021-03-25 MED ORDER — CEFADROXIL 500 MG PO CAPS: 500.0000 mg | ORAL_CAPSULE | Freq: Two times a day (BID) | ORAL | 0 refills | Status: DC

## 2021-03-25 NOTE — Progress Notes (Signed)
+   Urine culture although multiple species Patient had lower back pain when seen in MAU. Will treat for UTI. The patient was called and notified. Rx: Eulah Pont I, NP 03/25/2021 11:13 AM

## 2021-03-29 ENCOUNTER — Other Ambulatory Visit: Payer: Self-pay

## 2021-03-29 ENCOUNTER — Encounter: Payer: Self-pay | Admitting: *Deleted

## 2021-03-29 ENCOUNTER — Other Ambulatory Visit: Payer: Self-pay | Admitting: *Deleted

## 2021-03-29 ENCOUNTER — Ambulatory Visit: Payer: Medicaid Other | Attending: Obstetrics

## 2021-03-29 ENCOUNTER — Ambulatory Visit: Payer: Medicaid Other | Admitting: *Deleted

## 2021-03-29 VITALS — BP 134/84 | HR 104

## 2021-03-29 DIAGNOSIS — Z3A24 24 weeks gestation of pregnancy: Secondary | ICD-10-CM | POA: Diagnosis not present

## 2021-03-29 DIAGNOSIS — O99212 Obesity complicating pregnancy, second trimester: Secondary | ICD-10-CM | POA: Insufficient documentation

## 2021-03-29 DIAGNOSIS — O09293 Supervision of pregnancy with other poor reproductive or obstetric history, third trimester: Secondary | ICD-10-CM

## 2021-03-29 DIAGNOSIS — E669 Obesity, unspecified: Secondary | ICD-10-CM

## 2021-03-29 DIAGNOSIS — Z362 Encounter for other antenatal screening follow-up: Secondary | ICD-10-CM

## 2021-03-29 DIAGNOSIS — O09299 Supervision of pregnancy with other poor reproductive or obstetric history, unspecified trimester: Secondary | ICD-10-CM

## 2021-03-29 DIAGNOSIS — O09292 Supervision of pregnancy with other poor reproductive or obstetric history, second trimester: Secondary | ICD-10-CM | POA: Diagnosis not present

## 2021-03-29 DIAGNOSIS — Z6836 Body mass index (BMI) 36.0-36.9, adult: Secondary | ICD-10-CM

## 2021-04-15 ENCOUNTER — Telehealth: Payer: Self-pay | Admitting: Neurology

## 2021-04-15 NOTE — Telephone Encounter (Signed)
Pt called in stating the [facility] that she was referred to doesn't handle elbows, but wants [place] to send her notes over so they can review it to make sure.

## 2021-04-15 NOTE — Telephone Encounter (Signed)
Called patient back and I am resending patients notes to the hand center patient informed me they have lost her referral and office notes

## 2021-04-17 NOTE — L&D Delivery Note (Signed)
Delivery Note ?Pt labored well to +2 station with peanut. A few late decels noted.  ?She pushed for 15-9mins with FHT in 90s for a prolonged time. An FSE was placed and just as a kiwi was about to be placed, descent improved.  ?At 9:16 AM a viable female was delivered via Vaginal, Spontaneous (Presentation: Right Occiput Anterior).  APGAR: 8, 9; weight  pending. ?Nuchal x 1 reduced over infants head ?Anterior and posterior shoulders delivered easily next and body followed.  ?Cord was clamped and cut after a minute delay.    ?Placenta status: Spontaneous, Intact. Tomasa Blase.  Cord: 3 vessels with the following complications: None.  Cord pH: n/a ? ?Anesthesia: Epidural ?Episiotomy: None ?Lacerations: 2nd degree ?Suture Repair: 2.0 vicryl and 4-0 vicryl rapide ?Est. Blood Loss (mL):  ? ?Mom to postpartum.  Baby to Couplet care / Skin to Skin ?They desire circumcision for baby . ? ?Cathrine Muster ?06/26/2021, 9:41 AM ? ? ? ?

## 2021-05-10 ENCOUNTER — Other Ambulatory Visit: Payer: Self-pay

## 2021-05-10 ENCOUNTER — Encounter: Payer: Self-pay | Admitting: *Deleted

## 2021-05-10 ENCOUNTER — Other Ambulatory Visit: Payer: Self-pay | Admitting: Obstetrics and Gynecology

## 2021-05-10 ENCOUNTER — Ambulatory Visit: Payer: Medicaid Other | Admitting: *Deleted

## 2021-05-10 ENCOUNTER — Ambulatory Visit (HOSPITAL_BASED_OUTPATIENT_CLINIC_OR_DEPARTMENT_OTHER): Payer: Medicaid Other | Admitting: Obstetrics and Gynecology

## 2021-05-10 ENCOUNTER — Ambulatory Visit: Payer: Medicaid Other | Attending: Obstetrics

## 2021-05-10 VITALS — BP 133/87 | HR 98

## 2021-05-10 DIAGNOSIS — O09299 Supervision of pregnancy with other poor reproductive or obstetric history, unspecified trimester: Secondary | ICD-10-CM

## 2021-05-10 DIAGNOSIS — O24410 Gestational diabetes mellitus in pregnancy, diet controlled: Secondary | ICD-10-CM

## 2021-05-10 DIAGNOSIS — O99213 Obesity complicating pregnancy, third trimester: Secondary | ICD-10-CM

## 2021-05-10 DIAGNOSIS — Z6836 Body mass index (BMI) 36.0-36.9, adult: Secondary | ICD-10-CM | POA: Insufficient documentation

## 2021-05-10 DIAGNOSIS — O24419 Gestational diabetes mellitus in pregnancy, unspecified control: Secondary | ICD-10-CM

## 2021-05-10 DIAGNOSIS — O09293 Supervision of pregnancy with other poor reproductive or obstetric history, third trimester: Secondary | ICD-10-CM

## 2021-05-10 DIAGNOSIS — E668 Other obesity: Secondary | ICD-10-CM | POA: Diagnosis not present

## 2021-05-10 DIAGNOSIS — Z3A30 30 weeks gestation of pregnancy: Secondary | ICD-10-CM | POA: Insufficient documentation

## 2021-05-10 NOTE — Progress Notes (Signed)
Maternal-Fetal Medicine   Name: Julia Nelson DOB: 2000-05-16 MRN: [id number] Referring Provider: Marlow Baars, MD  I had the pleasure of [name] [facility].  She has a new diagnosis of gestational diabetes (abnormal 3-hour GTT).  She returned for fetal growth assessment. Patient will be meeting with the diabetic educator tomorrow. Obstetric history significant for a term vaginal delivery in 2020 of a female infant weighing 7 pounds and 6 ounces at birth.  Her pregnancy was complicated by preeclampsia with severe features.  Patient takes low-dose aspirin prophylaxis.  Ultrasound Fetal growth is appropriate for gestational age.  The estimated fetal weight is at the 88th percentile.  Amniotic fluid is normal and good fetal activity seen.  Gestational diabetes I explained the diagnosis of gestational diabetes.   I discussed blood glucose monitoring and the patient has an appointment to meet with our diabetic educator tomorrow. I emphasized the importance of good blood glucose control to prevent adverse fetal or neonatal outcomes.  I discussed blood glucose normal values. I encouraged her to check her blood glucose regularly.  Possible complications of gestational diabetes include fetal macrosomia, shoulder dystocia and birth injuries, stillbirth (in poorly controlled diabetes) and neonatal respiratory syndrome and other complications.  In about 85% of cases, gestational diabetes is well controlled by diet alone.  Exercise reduces the need for insulin.  Medical treatment includes oral hypoglycemics or insulin. Since her control of diabetes is not established, I recommend weekly BPP till delivery.  Timing of delivery: In well-controlled diabetes on diet, patient can be delivered at 85- or 40-weeks' gestation. Vaginal delivery is not contraindicated. Type 2 diabetes develops in about 25% to 40% of women with GDM. I recommend postpartum screening with 75-g  glucose load at 6 to 12 weeks after delivery.  Recommendations -An appointment was made for her to return in 4 weeks for fetal growth assessment and BPP. -If patient requires oral hypoglycemics or insulin we recommend BPP from her next visit (34 weeks) till delivery.  Thank you for consultation.  If you have any questions or concerns, please contact me the [facility].  Consultation including face-to-face (more than 50%) counseling 30 minutes.

## 2021-05-11 ENCOUNTER — Encounter: Payer: Medicaid Other | Attending: Obstetrics and Gynecology | Admitting: Registered"

## 2021-05-11 DIAGNOSIS — O24419 Gestational diabetes mellitus in pregnancy, unspecified control: Secondary | ICD-10-CM | POA: Diagnosis not present

## 2021-05-14 ENCOUNTER — Encounter: Payer: Self-pay | Admitting: Registered"

## 2021-05-14 DIAGNOSIS — O24419 Gestational diabetes mellitus in pregnancy, unspecified control: Secondary | ICD-10-CM | POA: Insufficient documentation

## 2021-05-14 NOTE — Progress Notes (Signed)
Patient was seen on 05/11/21 for Gestational Diabetes self-management class at [facility]. The following learning objectives were met by the patient during this course:  States the definition of Gestational Diabetes States why dietary management is important in controlling blood glucose Describes the effects each nutrient has on blood glucose levels Demonstrates ability to create a balanced meal plan Demonstrates carbohydrate counting  States when to check blood glucose levels Demonstrates proper blood glucose monitoring techniques States the effect of stress and exercise on blood glucose levels States the importance of limiting caffeine and abstaining from alcohol and smoking  Blood glucose monitor given: Accu-chek [employer] Me Lot #[id number] Exp: 07/04/2022 CBG: 132 mg/dL   Patient instructed to monitor glucose levels: FBS: 60 - <95; 1 hour: <140; 2 hour: <120  Patient received handouts: Nutrition Diabetes and Pregnancy, including carb counting list  Patient will be seen for follow-up as needed.

## 2021-05-25 ENCOUNTER — Encounter (HOSPITAL_COMMUNITY): Payer: Self-pay | Admitting: Obstetrics and Gynecology

## 2021-05-25 ENCOUNTER — Inpatient Hospital Stay (HOSPITAL_COMMUNITY)
Admission: AD | Admit: 2021-05-25 | Discharge: 2021-05-25 | Disposition: A | Discharge status: Home / Self Care | Payer: Medicaid Other | Attending: Obstetrics and Gynecology | Admitting: Obstetrics and Gynecology

## 2021-05-25 ENCOUNTER — Other Ambulatory Visit: Payer: Self-pay

## 2021-05-25 DIAGNOSIS — O09293 Supervision of pregnancy with other poor reproductive or obstetric history, third trimester: Secondary | ICD-10-CM | POA: Diagnosis not present

## 2021-05-25 DIAGNOSIS — R519 Headache, unspecified: Secondary | ICD-10-CM | POA: Diagnosis not present

## 2021-05-25 DIAGNOSIS — Z3A32 32 weeks gestation of pregnancy: Secondary | ICD-10-CM | POA: Insufficient documentation

## 2021-05-25 DIAGNOSIS — O26893 Other specified pregnancy related conditions, third trimester: Secondary | ICD-10-CM | POA: Insufficient documentation

## 2021-05-25 DIAGNOSIS — O133 Gestational [pregnancy-induced] hypertension without significant proteinuria, third trimester: Secondary | ICD-10-CM

## 2021-05-25 HISTORY — DX: Gestational diabetes mellitus in pregnancy, unspecified control: O24.419

## 2021-05-25 LAB — COMPREHENSIVE METABOLIC PANEL
ALT: 24 U/L (ref 0–44)
AST: 24 U/L (ref 15–41)
Albumin: 2.9 g/dL — ABNORMAL LOW (ref 3.5–5.0)
Alkaline Phosphatase: 167 U/L — ABNORMAL HIGH (ref 38–126)
Anion gap: 10 (ref 5–15)
BUN: <5 mg/dL — ABNORMAL LOW (ref 6–20)
CO2: 19 mmol/L — ABNORMAL LOW (ref 22–32)
Calcium: 9 mg/dL (ref 8.9–10.3)
Chloride: 105 mmol/L (ref 98–111)
Creatinine, Ser: 0.42 mg/dL — ABNORMAL LOW (ref 0.44–1.00)
GFR, Estimated: >60 mL/min (ref >60)
Glucose, Bld: 120 mg/dL — ABNORMAL HIGH (ref 70–99)
Potassium: 3.4 mmol/L — ABNORMAL LOW (ref 3.5–5.1)
Sodium: 134 mmol/L — ABNORMAL LOW (ref 135–145)
Total Bilirubin: 0.4 mg/dL (ref 0.3–1.2)
Total Protein: 6.3 g/dL — ABNORMAL LOW (ref 6.5–8.1)

## 2021-05-25 LAB — CBC
HCT: 41.5 % (ref 36.0–46.0)
Hemoglobin: 13.9 g/dL (ref 12.0–15.0)
MCH: 29.3 pg (ref 26.0–34.0)
MCHC: 33.5 g/dL (ref 30.0–36.0)
MCV: 87.4 fL (ref 80.0–100.0)
Platelets: 194 10*3/uL (ref 150–400)
RBC: 4.75 MIL/uL (ref 3.87–5.11)
RDW: 12.5 % (ref 11.5–15.5)
WBC: 9.1 10*3/uL (ref 4.0–10.5)
nRBC: 0 % (ref 0.0–0.2)

## 2021-05-25 LAB — URINALYSIS, ROUTINE W REFLEX MICROSCOPIC
Bilirubin Urine: NEGATIVE
Glucose, UA: 150 mg/dL — AB
Hgb urine dipstick: NEGATIVE
Ketones, ur: NEGATIVE mg/dL
Nitrite: NEGATIVE
Protein, ur: 30 mg/dL — AB
Specific Gravity, Urine: 1.015 (ref 1.005–1.030)
pH: 5 (ref 5.0–8.0)

## 2021-05-25 LAB — PROTEIN / CREATININE RATIO, URINE
Creatinine, Urine: 103.37 mg/dL
Protein Creatinine Ratio: 0.49 mg/mg{Cre} — ABNORMAL HIGH (ref 0.00–0.15)
Total Protein, Urine: 51 mg/dL

## 2021-05-25 MED ORDER — LACTATED RINGERS IV SOLN: INTRAVENOUS | Status: DC

## 2021-05-25 MED ORDER — ACETAMINOPHEN 500 MG PO TABS
1000.0000 mg | ORAL_TABLET | Freq: Once | ORAL | Status: AC
  Administered 2021-05-25: 1000 mg via ORAL
  Filled 2021-05-25: qty 2

## 2021-05-25 MED ORDER — METOCLOPRAMIDE HCL 5 MG/ML IJ SOLN
10.0000 mg | Freq: Once | INTRAMUSCULAR | Status: AC
Start: 2021-05-25 — End: 2021-05-25
  Administered 2021-05-25: 10 mg via INTRAVENOUS
  Filled 2021-05-25: qty 2

## 2021-05-25 MED ORDER — CYCLOBENZAPRINE HCL 5 MG PO TABS
10.0000 mg | ORAL_TABLET | Freq: Once | ORAL | Status: AC
  Administered 2021-05-25: 10 mg via ORAL
  Filled 2021-05-25: qty 2

## 2021-05-25 MED ORDER — DIPHENHYDRAMINE HCL 50 MG/ML IJ SOLN
25.0000 mg | Freq: Once | INTRAMUSCULAR | Status: AC
  Administered 2021-05-25: 25 mg via INTRAVENOUS
  Filled 2021-05-25: qty 1

## 2021-05-25 NOTE — MAU Note (Signed)
Presents for BP evaluation.  Took BP @ home, BP 178/98.  Reports has H/A and blurred vision since this morning.  Reports took Tylenol for H/A @ 1100, unrelieved.  Endorses epigastric pain earlier today, but has since resolved.  Endorses +FM earlier today, none recently.  Denies VB or LOF.

## 2021-05-25 NOTE — MAU Provider Note (Addendum)
Chief Complaint:  BP Evaluation   Event Date/Time   First Provider Initiated Contact with Patient 05/25/21 1802      HPI: Julia Nelson is a 21 y.o. G2P1001 at [redacted]w[redacted]d who presents to maternity admissions reporting headache since this morning, not resolved by Tylenol 1000 mg and BP of 178/98 at home. She reports associated blurred vision, vision "fading out" today.  She had HTN in MAU on 03/21/22 and hx of preeclampsia with first pregnancy but follow up BPs have been normal so there is no diagnosis for HTN at this time.  There are no other symptoms . She has not tried other treatments.  She reports good fetal movement, denies LOF, vaginal bleeding, vaginal itching/burning, urinary symptoms, dizziness, n/v, or fever/chills.     HPI  Past Medical History: Past Medical History:  Diagnosis Date   Gestational diabetes    Hypertension 2020    Past obstetric history: OB History  Gravida Para Term Preterm AB Living  2 1 1  0 0 1  SAB IAB Ectopic Multiple Live Births  0 0 0 0 1    # Outcome Date GA Lbr Len/2nd Weight Sex Delivery Anes PTL Lv  2 Current           1 Term 07/30/18 [redacted]w[redacted]d 08:08 / 00:31 3354 g M Vag-Spont EPI  LIV    Past Surgical History: Past Surgical History:  Procedure Laterality Date   CHOLECYSTECTOMY  2016   CHOLECYSTECTOMY      Family History: Family History  Problem Relation Age of Onset   Diabetes Mother        Type II    Social History: Social History   Tobacco Use   Smoking status: Never   Smokeless tobacco: Never  Vaping Use   Vaping Use: Never used  Substance Use Topics   Alcohol use: No   Drug use: No    Allergies: No Known Allergies  Meds:  Medications Prior to Admission  Medication Sig Dispense Refill Last Dose   acetaminophen (TYLENOL) 500 MG tablet Take 1,000 mg by mouth every 6 (six) hours as needed.   05/25/2021 at 1100   BABY ASPIRIN PO Take by mouth.   05/25/2021 at 0800   prenatal vitamin w/FE, FA (NATACHEW) 29-1 MG CHEW chewable  tablet Chew 1 tablet by mouth daily at 12 noon.   05/25/2021 at 0800   cefadroxil (DURICEF) 500 MG capsule Take 1 capsule (500 mg total) by mouth 2 (two) times daily. 14 capsule 0     ROS:  Review of Systems  Constitutional:  Negative for chills, fatigue and fever.  Eyes:  Positive for visual disturbance.  Respiratory:  Negative for shortness of breath.   Cardiovascular:  Negative for chest pain.  Gastrointestinal:  Negative for abdominal pain, nausea and vomiting.  Genitourinary:  Negative for difficulty urinating, dysuria, flank pain, pelvic pain, vaginal bleeding, vaginal discharge and vaginal pain.  Neurological:  Positive for headaches. Negative for dizziness.  Psychiatric/Behavioral: Negative.      I have reviewed patient's Past Medical Hx, Surgical Hx, Family Hx, Social Hx, medications and allergies.   Physical Exam  Patient Vitals for the past 24 hrs:  BP Temp Temp src Pulse Resp SpO2 Height Weight  05/25/21 2016 -- -- -- -- -- 99 % -- --  05/25/21 2011 -- -- -- -- -- 99 % -- --  05/25/21 2006 -- -- -- -- -- 99 % -- --  05/25/21 2001 (!) 152/80 -- -- (!) 121 --  99 % -- --  05/25/21 1956 -- -- -- -- -- 99 % -- --  05/25/21 1951 -- -- -- -- -- 100 % -- --  05/25/21 1946 (!) 142/96 -- -- (!) 136 -- 99 % -- --  05/25/21 1941 -- -- -- -- -- 99 % -- --  05/25/21 1936 -- -- -- -- -- 99 % -- --  05/25/21 1932 (!) 146/80 -- -- (!) 118 -- -- -- --  05/25/21 1931 -- -- -- -- -- 99 % -- --  05/25/21 1926 -- -- -- -- -- 99 % -- --  05/25/21 1921 -- -- -- -- -- 99 % -- --  05/25/21 1917 140/77 -- -- (!) 112 -- -- -- --  05/25/21 1916 -- -- -- -- -- 99 % -- --  05/25/21 1911 -- -- -- -- -- 99 % -- --  05/25/21 1906 -- -- -- -- -- 99 % -- --  05/25/21 1903 136/74 -- -- (!) 127 -- -- -- --  05/25/21 1901 -- -- -- -- -- 100 % -- --  05/25/21 1856 -- -- -- -- -- 99 % -- --  05/25/21 1851 -- -- -- -- -- 100 % -- --  05/25/21 1849 125/78 -- -- (!) 122 -- -- -- --  05/25/21 1826 -- -- --  -- -- 97 % -- --  05/25/21 1821 -- -- -- -- -- 98 % -- --  05/25/21 1817 (!) 145/76 -- -- (!) 121 -- -- -- --  05/25/21 1816 -- -- -- -- -- 98 % -- --  05/25/21 1811 -- -- -- -- -- 99 % -- --  05/25/21 1805 -- -- -- -- -- 99 % -- --  05/25/21 1801 (!) 148/86 -- -- (!) 131 -- 98 % -- --  05/25/21 1759 (!) 145/91 -- -- (!) 134 -- -- -- --  05/25/21 1744 (!) 147/85 98 F (36.7 C) Oral (!) 122 20 -- -- --  05/25/21 1731 -- -- -- -- -- -- 5\' 7"  (1.702 m) 115.3 kg   Constitutional: Well-developed, well-nourished female in no acute distress.  Cardiovascular: normal rate Respiratory: normal effort GI: Abd soft, non-tender, gravid appropriate for gestational age.  MS: Extremities nontender, no edema, normal ROM Neurologic: Alert and oriented x 4.  GU: Neg CVAT.  PELVIC EXAM: Deferred     FHT:  Baseline 150 , moderate variability, accelerations present, no decelerations Contractions: none on toco or to palpation   Labs: Results for orders placed or performed during the hospital encounter of 05/25/21 (from the past 24 hour(s))  Urinalysis, Routine w reflex microscopic Urine, Clean Catch     Status: Abnormal   Collection Time: 05/25/21  6:15 PM  Result Value Ref Range   Color, Urine YELLOW YELLOW   APPearance HAZY (A) CLEAR   Specific Gravity, Urine 1.015 1.005 - 1.030   pH 5.0 5.0 - 8.0   Glucose, UA 150 (A) NEGATIVE mg/dL   Hgb urine dipstick NEGATIVE NEGATIVE   Bilirubin Urine NEGATIVE NEGATIVE   Ketones, ur NEGATIVE NEGATIVE mg/dL   Protein, ur 30 (A) NEGATIVE mg/dL   Nitrite NEGATIVE NEGATIVE   Leukocytes,Ua LARGE (A) NEGATIVE   RBC / HPF 0-5 0 - 5 RBC/hpf   WBC, UA 21-50 0 - 5 WBC/hpf   Bacteria, UA FEW (A) NONE SEEN   Squamous Epithelial / LPF 6-10 0 - 5   Mucus PRESENT   CBC     Status: None  Collection Time: 05/25/21  6:44 PM  Result Value Ref Range   WBC 9.1 4.0 - 10.5 K/uL   RBC 4.75 3.87 - 5.11 MIL/uL   Hemoglobin 13.9 12.0 - 15.0 g/dL   HCT 41.5 36.0 - 46.0 %    MCV 87.4 80.0 - 100.0 fL   MCH 29.3 26.0 - 34.0 pg   MCHC 33.5 30.0 - 36.0 g/dL   RDW 12.5 11.5 - 15.5 %   Platelets 194 150 - 400 K/uL   nRBC 0.0 0.0 - 0.2 %  Comprehensive metabolic panel     Status: Abnormal   Collection Time: 05/25/21  6:44 PM  Result Value Ref Range   Sodium 134 (L) 135 - 145 mmol/L   Potassium 3.4 (L) 3.5 - 5.1 mmol/L   Chloride 105 98 - 111 mmol/L   CO2 19 (L) 22 - 32 mmol/L   Glucose, Bld 120 (H) 70 - 99 mg/dL   BUN <5 (L) 6 - 20 mg/dL   Creatinine, Ser 0.42 (L) 0.44 - 1.00 mg/dL   Calcium 9.0 8.9 - 10.3 mg/dL   Total Protein 6.3 (L) 6.5 - 8.1 g/dL   Albumin 2.9 (L) 3.5 - 5.0 g/dL   AST 24 15 - 41 U/L   ALT 24 0 - 44 U/L   Alkaline Phosphatase 167 (H) 38 - 126 U/L   Total Bilirubin 0.4 0.3 - 1.2 mg/dL   GFR, Estimated >60 >60 mL/min   Anion gap 10 5 - 15      Imaging:   MAU Course/MDM: Orders Placed This Encounter  Procedures   CBC   Comprehensive metabolic panel   Protein / creatinine ratio, urine   Urinalysis, Routine w reflex microscopic    Meds ordered this encounter  Medications   metoCLOPramide (REGLAN) injection 10 mg   diphenhydrAMINE (BENADRYL) injection 25 mg   lactated ringers infusion   acetaminophen (TYLENOL) tablet 1,000 mg   cyclobenzaprine (FLEXERIL) tablet 10 mg     NST reviewed and reactive BP elevated in MAU, no severe range BPs Reglan and Benadryl IV given, no improvement in headache Tylenol and Flexeril given in MAU (6+ hours after pt Tylenol at home) Discussed with Dr Terri Piedra, and consider IV caffeine as next option PEC labs pending  Report to  Hansel Feinstein, CNM   Lisa Leftwich-Kirby Certified Nurse-Midwife 05/25/2021 8:23 PM   Vitals:   05/25/21 2046 05/25/21 2047 05/25/21 2051 05/25/21 2109  BP:  (!) 147/86  (!) 146/93  Pulse:  (!) 131  (!) 122  Resp:    17  Temp:    99.7 F (37.6 C)  TempSrc:    Oral  SpO2: 99%  99% 100%  Weight:      Height:        Headache improved after medication Per  conversation with Dr Terri Piedra, it was reported to me that patient could go home and have close followup in office with BP check this week Message left on office phone and patient instructed to call office in am Consulted with Dr Elgie Congo, would ont put on meds at this time  A:  S[facility]gle IUP at [redacted]w[redacted]d       Gestational Hypertension       Headache resolved with Tylenol and Flexeril     P:  Discharge home       BP check in office 24-48hrs       Strict preeclampsia precautions       Encouraged to return if she develops worsening  of symptoms, increase in pain, fever, or other concerning symptoms.   Seabron Spates, CNM

## 2021-05-31 ENCOUNTER — Inpatient Hospital Stay (HOSPITAL_COMMUNITY)
Admission: AD | Admit: 2021-05-31 | Discharge: 2021-05-31 | Disposition: A | Discharge status: Home / Self Care | Payer: Medicaid Other | Attending: Obstetrics and Gynecology | Admitting: Obstetrics and Gynecology

## 2021-05-31 ENCOUNTER — Encounter (HOSPITAL_COMMUNITY): Payer: Self-pay | Admitting: Obstetrics and Gynecology

## 2021-05-31 ENCOUNTER — Other Ambulatory Visit: Payer: Self-pay

## 2021-05-31 DIAGNOSIS — O26893 Other specified pregnancy related conditions, third trimester: Secondary | ICD-10-CM | POA: Insufficient documentation

## 2021-05-31 DIAGNOSIS — Z3A33 33 weeks gestation of pregnancy: Secondary | ICD-10-CM | POA: Diagnosis not present

## 2021-05-31 DIAGNOSIS — R519 Headache, unspecified: Secondary | ICD-10-CM

## 2021-05-31 DIAGNOSIS — O133 Gestational [pregnancy-induced] hypertension without significant proteinuria, third trimester: Secondary | ICD-10-CM | POA: Diagnosis not present

## 2021-05-31 LAB — COMPREHENSIVE METABOLIC PANEL
ALT: 21 U/L (ref 0–44)
AST: 21 U/L (ref 15–41)
Albumin: 2.7 g/dL — ABNORMAL LOW (ref 3.5–5.0)
Alkaline Phosphatase: 174 U/L — ABNORMAL HIGH (ref 38–126)
Anion gap: 10 (ref 5–15)
BUN: 5 mg/dL — ABNORMAL LOW (ref 6–20)
CO2: 20 mmol/L — ABNORMAL LOW (ref 22–32)
Calcium: 8.8 mg/dL — ABNORMAL LOW (ref 8.9–10.3)
Chloride: 107 mmol/L (ref 98–111)
Creatinine, Ser: 0.73 mg/dL (ref 0.44–1.00)
GFR, Estimated: >60 mL/min (ref >60)
Glucose, Bld: 115 mg/dL — ABNORMAL HIGH (ref 70–99)
Potassium: 3.7 mmol/L (ref 3.5–5.1)
Sodium: 137 mmol/L (ref 135–145)
Total Bilirubin: 0.5 mg/dL (ref 0.3–1.2)
Total Protein: 6 g/dL — ABNORMAL LOW (ref 6.5–8.1)

## 2021-05-31 LAB — CBC
HCT: 38.4 % (ref 36.0–46.0)
Hemoglobin: 13.5 g/dL (ref 12.0–15.0)
MCH: 30.1 pg (ref 26.0–34.0)
MCHC: 35.2 g/dL (ref 30.0–36.0)
MCV: 85.7 fL (ref 80.0–100.0)
Platelets: 189 10*3/uL (ref 150–400)
RBC: 4.48 MIL/uL (ref 3.87–5.11)
RDW: 12.6 % (ref 11.5–15.5)
WBC: 9.4 10*3/uL (ref 4.0–10.5)
nRBC: 0 % (ref 0.0–0.2)

## 2021-05-31 LAB — URINALYSIS, ROUTINE W REFLEX MICROSCOPIC
Bilirubin Urine: NEGATIVE
Glucose, UA: NEGATIVE mg/dL
Hgb urine dipstick: NEGATIVE
Ketones, ur: 5 mg/dL — AB
Nitrite: NEGATIVE
Protein, ur: 30 mg/dL — AB
Specific Gravity, Urine: 1.023 (ref 1.005–1.030)
pH: 6 (ref 5.0–8.0)

## 2021-05-31 LAB — PROTEIN / CREATININE RATIO, URINE
Creatinine, Urine: 169.84 mg/dL
Protein Creatinine Ratio: 0.21 mg/mg{Cre} — ABNORMAL HIGH (ref 0.00–0.15)
Total Protein, Urine: 35 mg/dL

## 2021-05-31 MED ORDER — DIPHENHYDRAMINE HCL 50 MG/ML IJ SOLN
25.0000 mg | Freq: Once | INTRAMUSCULAR | Status: AC
  Administered 2021-05-31: 25 mg via INTRAVENOUS
  Filled 2021-05-31: qty 1

## 2021-05-31 MED ORDER — LACTATED RINGERS IV BOLUS
1000.0000 mL | Freq: Once | INTRAVENOUS | Status: AC
Start: 2021-05-31 — End: 2021-05-31
  Administered 2021-05-31: 1000 mL via INTRAVENOUS

## 2021-05-31 MED ORDER — METOCLOPRAMIDE HCL 10 MG PO TABS
10.0000 mg | ORAL_TABLET | Freq: Four times a day (QID) | ORAL | 0 refills | Status: DC
Start: 2021-05-31 — End: 2021-06-24

## 2021-05-31 MED ORDER — METOCLOPRAMIDE HCL 5 MG/ML IJ SOLN
10.0000 mg | Freq: Once | INTRAMUSCULAR | Status: AC
  Administered 2021-05-31: 10 mg via INTRAVENOUS
  Filled 2021-05-31: qty 2

## 2021-05-31 NOTE — MAU Provider Note (Signed)
History     CSN: [id number]  Arrival date and time: 05/31/21 1634   Event Date/Time   First Provider Initiated Contact with Patient 05/31/21 1732      Chief Complaint  Patient presents with   Hypertension   Headache   HPI Julia Nelson is a 21 y.o. G2P1001 at [redacted]w[redacted]d who presents with a headache. She reports a frontal headache that she rates a 6/10. She has tried tylenol and flexeril with no relief. She states she checked her BP at home and it was 150s/90s. She denies any visual changes or epigastric pain. She denies any abdominal pain, vaginal bleeding or discharge. She reports normal fetal movement.   OB History     Gravida  2   Para  1   Term  1   Preterm  0   AB  0   Living  1      SAB  0   IAB  0   Ectopic  0   Multiple  0   Live Births  1           Past Medical History:  Diagnosis Date   Gestational diabetes    Hypertension 2020    Past Surgical History:  Procedure Laterality Date   CHOLECYSTECTOMY  2016   CHOLECYSTECTOMY      Family History  Problem Relation Age of Onset   Diabetes Mother        Type II    Social History   Tobacco Use   Smoking status: Never   Smokeless tobacco: Never  Vaping Use   Vaping Use: Never used  Substance Use Topics   Alcohol use: No   Drug use: No    Allergies: No Known Allergies  Medications Prior to Admission  Medication Sig Dispense Refill Last Dose   acetaminophen (TYLENOL) 500 MG tablet Take 1,000 mg by mouth every 6 (six) hours as needed.   05/31/2021   BABY ASPIRIN PO Take by mouth.   05/31/2021   cyclobenzaprine (FLEXERIL) 10 MG tablet Take 10 mg by mouth 3 (three) times daily as needed for muscle spasms.   05/31/2021   glyBURIDE (DIABETA) 1.25 MG tablet Take 1.25 mg by mouth daily with breakfast.      glyBURIDE (DIABETA) 5 MG tablet Take 10 mg by mouth daily after supper.      prenatal vitamin w/FE, FA (NATACHEW) 29-1 MG CHEW chewable tablet Chew 1 tablet by mouth daily at 12 noon.    05/31/2021   cefadroxil (DURICEF) 500 MG capsule Take 1 capsule (500 mg total) by mouth 2 (two) times daily. 14 capsule 0     Review of Systems  Constitutional: Negative.  Negative for fatigue and fever.  HENT: Negative.    Respiratory: Negative.  Negative for shortness of breath.   Cardiovascular: Negative.  Negative for chest pain.  Gastrointestinal: Negative.  Negative for abdominal pain, constipation, diarrhea, nausea and vomiting.  Genitourinary: Negative.  Negative for dysuria, vaginal bleeding and vaginal discharge.  Neurological:  Positive for headaches. Negative for dizziness.  Physical Exam   Blood pressure (!) 121/95, pulse (!) 115, temperature 98.5 F (36.9 C), temperature source Oral, resp. rate 17, height 5\' 7"  (1.702 m), weight 116.8 kg, last menstrual period 09/25/2020, SpO2 100 %, unknown if currently breastfeeding.  Patient Vitals for the past 24 hrs:  BP Temp Temp src Pulse Resp SpO2 Height Weight  05/31/21 1816 133/74 -- -- (!) 115 -- -- -- --  05/31/21 1801 116/80 -- -- Marland Kitchen  117 -- -- -- --  05/31/21 1746 116/80 -- -- (!) 110 -- -- -- --  05/31/21 1732 95/67 -- -- (!) 124 -- -- -- --  05/31/21 1723 139/80 -- -- (!) 123 -- -- -- --  05/31/21 1654 (!) 121/95 98.5 F (36.9 C) Oral (!) 115 17 100 % 5\' 7"  (1.702 m) 116.8 kg   Physical Exam Vitals and nursing note reviewed.  Constitutional:      General: She is not in acute distress.    Appearance: She is well-developed.  HENT:     Head: Normocephalic.  Eyes:     Pupils: Pupils are equal, round, and reactive to light.  Cardiovascular:     Rate and Rhythm: Normal rate and regular rhythm.     Heart sounds: Normal heart sounds.  Pulmonary:     Effort: Pulmonary effort is normal. No respiratory distress.     Breath sounds: Normal breath sounds.  Abdominal:     General: Bowel sounds are normal. There is no distension.     Palpations: Abdomen is soft.     Tenderness: There is no abdominal tenderness.  Skin:     General: Skin is warm and dry.  Neurological:     Mental Status: She is alert and oriented to person, place, and time.  Psychiatric:        Mood and Affect: Mood normal.        Behavior: Behavior normal.        Thought Content: Thought content normal.        Judgment: Judgment normal.   Fetal Tracing:  Baseline: 135 Variability: moderate Accels: 15x15 Decels: variable x1  Toco: none   MAU Course  Procedures Results for orders placed or performed during the hospital encounter of 05/31/21 (from the past 24 hour(s))  CBC     Status: None   Collection Time: 05/31/21  4:51 PM  Result Value Ref Range   WBC 9.4 4.0 - 10.5 K/uL   RBC 4.48 3.87 - 5.11 MIL/uL   Hemoglobin 13.5 12.0 - 15.0 g/dL   HCT 06/02/21 16.1 - 09.6 %   MCV 85.7 80.0 - 100.0 fL   MCH 30.1 26.0 - 34.0 pg   MCHC 35.2 30.0 - 36.0 g/dL   RDW 04.5 40.9 - 81.1 %   Platelets 189 150 - 400 K/uL   nRBC 0.0 0.0 - 0.2 %  Comprehensive metabolic panel     Status: Abnormal   Collection Time: 05/31/21  4:51 PM  Result Value Ref Range   Sodium 137 135 - 145 mmol/L   Potassium 3.7 3.5 - 5.1 mmol/L   Chloride 107 98 - 111 mmol/L   CO2 20 (L) 22 - 32 mmol/L   Glucose, Bld 115 (H) 70 - 99 mg/dL   BUN 5 (L) 6 - 20 mg/dL   Creatinine, Ser 06/02/21 0.44 - 1.00 mg/dL   Calcium 8.8 (L) 8.9 - 10.3 mg/dL   Total Protein 6.0 (L) 6.5 - 8.1 g/dL   Albumin 2.7 (L) 3.5 - 5.0 g/dL   AST 21 15 - 41 U/L   ALT 21 0 - 44 U/L   Alkaline Phosphatase 174 (H) 38 - 126 U/L   Total Bilirubin 0.5 0.3 - 1.2 mg/dL   GFR, Estimated 7.82 >95 mL/min   Anion gap 10 5 - 15  Urinalysis, Routine w reflex microscopic Urine, Clean Catch     Status: Abnormal   Collection Time: 05/31/21  5:26 PM  Result Value Ref  Range   Color, Urine YELLOW YELLOW   APPearance HAZY (A) CLEAR   Specific Gravity, Urine 1.023 1.005 - 1.030   pH 6.0 5.0 - 8.0   Glucose, UA NEGATIVE NEGATIVE mg/dL   Hgb urine dipstick NEGATIVE NEGATIVE   Bilirubin Urine NEGATIVE NEGATIVE    Ketones, ur 5 (A) NEGATIVE mg/dL   Protein, ur 30 (A) NEGATIVE mg/dL   Nitrite NEGATIVE NEGATIVE   Leukocytes,Ua SMALL (A) NEGATIVE   RBC / HPF 0-5 0 - 5 RBC/hpf   WBC, UA 6-10 0 - 5 WBC/hpf   Bacteria, UA MANY (A) NONE SEEN   Squamous Epithelial / LPF 0-5 0 - 5   Mucus PRESENT   Protein / creatinine ratio, urine     Status: Abnormal   Collection Time: 05/31/21  5:26 PM  Result Value Ref Range   Creatinine, Urine 169.84 mg/dL   Total Protein, Urine 35 mg/dL   Protein Creatinine Ratio 0.21 (H) 0.00 - 0.15 mg/mg[Cre]    MDM UA CBC, CMP, Protein/creat ratio LR bolus Benedryl and Reglan IV  Patient reports complete resolution of headache  Assessment and Plan   1. Gestational hypertension, third trimester   2. Pregnancy headache in third trimester   3. [redacted] weeks gestation of pregnancy    -Discharge home in stable condition -Rx for reglan sent to patient's pharmacy -Preeclampsia precautions discussed -Patient advised to follow-up with OB as scheduled for prenatal care tomorrow -Patient may return to MAU as needed or if her condition were to change or worsen   Rolm Bookbinder CNM 05/31/2021, 5:32 PM

## 2021-05-31 NOTE — MAU Note (Signed)
...  Julia Nelson is a 21 y.o. at [redacted]w[redacted]d here in MAU reporting: HA since 1100 this morning. She states she took one Flexeril and two 500 mg Tylenol at 1100 and states it did not decrease her HA at all. Denies RUQ/epigastric pain but endorses visual floaters each hour and swelling of her feet. +FM. No VB or LOF.  Last meal at 1400.  Pain score:  8/10 HA  FHT: 168 doppler Lab orders placed from triage: UA

## 2021-06-08 ENCOUNTER — Other Ambulatory Visit: Payer: Self-pay | Admitting: *Deleted

## 2021-06-08 ENCOUNTER — Encounter: Payer: Self-pay | Admitting: *Deleted

## 2021-06-08 ENCOUNTER — Ambulatory Visit: Payer: Medicaid Other | Attending: Obstetrics and Gynecology

## 2021-06-08 ENCOUNTER — Ambulatory Visit: Payer: Medicaid Other | Admitting: *Deleted

## 2021-06-08 ENCOUNTER — Other Ambulatory Visit: Payer: Self-pay

## 2021-06-08 VITALS — BP 133/81 | HR 107

## 2021-06-08 DIAGNOSIS — O99213 Obesity complicating pregnancy, third trimester: Secondary | ICD-10-CM

## 2021-06-08 DIAGNOSIS — O24419 Gestational diabetes mellitus in pregnancy, unspecified control: Secondary | ICD-10-CM

## 2021-06-08 DIAGNOSIS — O24415 Gestational diabetes mellitus in pregnancy, controlled by oral hypoglycemic drugs: Secondary | ICD-10-CM

## 2021-06-08 DIAGNOSIS — R638 Other symptoms and signs concerning food and fluid intake: Secondary | ICD-10-CM

## 2021-06-08 DIAGNOSIS — Z3A34 34 weeks gestation of pregnancy: Secondary | ICD-10-CM

## 2021-06-08 DIAGNOSIS — O3663X0 Maternal care for excessive fetal growth, third trimester, not applicable or unspecified: Secondary | ICD-10-CM

## 2021-06-08 DIAGNOSIS — O09299 Supervision of pregnancy with other poor reproductive or obstetric history, unspecified trimester: Secondary | ICD-10-CM | POA: Diagnosis present

## 2021-06-08 DIAGNOSIS — O133 Gestational [pregnancy-induced] hypertension without significant proteinuria, third trimester: Secondary | ICD-10-CM

## 2021-06-13 ENCOUNTER — Telehealth (HOSPITAL_COMMUNITY): Payer: Self-pay | Admitting: *Deleted

## 2021-06-13 NOTE — Telephone Encounter (Signed)
Preadmission screen  

## 2021-06-14 LAB — OB RESULTS CONSOLE GBS: GBS: NEGATIVE

## 2021-06-16 ENCOUNTER — Encounter (HOSPITAL_COMMUNITY): Payer: Self-pay | Admitting: *Deleted

## 2021-06-16 ENCOUNTER — Telehealth (HOSPITAL_COMMUNITY): Payer: Self-pay | Admitting: *Deleted

## 2021-06-16 NOTE — Telephone Encounter (Signed)
Preadmission screen  

## 2021-06-17 ENCOUNTER — Ambulatory Visit: Payer: Medicaid Other | Admitting: *Deleted

## 2021-06-17 ENCOUNTER — Encounter: Payer: Self-pay | Admitting: *Deleted

## 2021-06-17 ENCOUNTER — Ambulatory Visit: Payer: Medicaid Other | Attending: Obstetrics

## 2021-06-17 ENCOUNTER — Other Ambulatory Visit: Payer: Self-pay

## 2021-06-17 VITALS — BP 134/79 | HR 105

## 2021-06-17 DIAGNOSIS — O24414 Gestational diabetes mellitus in pregnancy, insulin controlled: Secondary | ICD-10-CM

## 2021-06-17 DIAGNOSIS — R638 Other symptoms and signs concerning food and fluid intake: Secondary | ICD-10-CM | POA: Insufficient documentation

## 2021-06-17 DIAGNOSIS — O3663X0 Maternal care for excessive fetal growth, third trimester, not applicable or unspecified: Secondary | ICD-10-CM | POA: Insufficient documentation

## 2021-06-17 DIAGNOSIS — O24419 Gestational diabetes mellitus in pregnancy, unspecified control: Secondary | ICD-10-CM | POA: Insufficient documentation

## 2021-06-17 DIAGNOSIS — O133 Gestational [pregnancy-induced] hypertension without significant proteinuria, third trimester: Secondary | ICD-10-CM | POA: Insufficient documentation

## 2021-06-17 DIAGNOSIS — Z3A35 35 weeks gestation of pregnancy: Secondary | ICD-10-CM

## 2021-06-17 DIAGNOSIS — O09293 Supervision of pregnancy with other poor reproductive or obstetric history, third trimester: Secondary | ICD-10-CM | POA: Diagnosis not present

## 2021-06-23 ENCOUNTER — Encounter: Payer: Self-pay | Admitting: *Deleted

## 2021-06-23 ENCOUNTER — Other Ambulatory Visit: Payer: Self-pay

## 2021-06-23 ENCOUNTER — Encounter (HOSPITAL_COMMUNITY): Payer: Self-pay | Admitting: Obstetrics and Gynecology

## 2021-06-23 ENCOUNTER — Ambulatory Visit: Payer: Medicaid Other | Attending: Obstetrics

## 2021-06-23 ENCOUNTER — Ambulatory Visit: Payer: Medicaid Other | Admitting: *Deleted

## 2021-06-23 VITALS — BP 131/86 | HR 115

## 2021-06-23 DIAGNOSIS — O133 Gestational [pregnancy-induced] hypertension without significant proteinuria, third trimester: Secondary | ICD-10-CM

## 2021-06-23 DIAGNOSIS — Z3A36 36 weeks gestation of pregnancy: Secondary | ICD-10-CM

## 2021-06-23 DIAGNOSIS — O3663X0 Maternal care for excessive fetal growth, third trimester, not applicable or unspecified: Secondary | ICD-10-CM | POA: Diagnosis not present

## 2021-06-23 DIAGNOSIS — O24414 Gestational diabetes mellitus in pregnancy, insulin controlled: Secondary | ICD-10-CM | POA: Diagnosis not present

## 2021-06-23 DIAGNOSIS — R638 Other symptoms and signs concerning food and fluid intake: Secondary | ICD-10-CM | POA: Diagnosis not present

## 2021-06-23 DIAGNOSIS — O24419 Gestational diabetes mellitus in pregnancy, unspecified control: Secondary | ICD-10-CM

## 2021-06-24 ENCOUNTER — Encounter (HOSPITAL_COMMUNITY): Payer: Self-pay | Admitting: Obstetrics and Gynecology

## 2021-06-24 ENCOUNTER — Inpatient Hospital Stay (HOSPITAL_COMMUNITY)
Admission: AD | Admit: 2021-06-24 | Discharge: 2021-06-24 | Disposition: A | Discharge status: Home / Self Care | Payer: Medicaid Other | Attending: Obstetrics and Gynecology | Admitting: Obstetrics and Gynecology

## 2021-06-24 DIAGNOSIS — O1403 Mild to moderate pre-eclampsia, third trimester: Secondary | ICD-10-CM | POA: Insufficient documentation

## 2021-06-24 DIAGNOSIS — O99891 Other specified diseases and conditions complicating pregnancy: Secondary | ICD-10-CM

## 2021-06-24 DIAGNOSIS — O09293 Supervision of pregnancy with other poor reproductive or obstetric history, third trimester: Secondary | ICD-10-CM | POA: Diagnosis not present

## 2021-06-24 DIAGNOSIS — Z3A36 36 weeks gestation of pregnancy: Secondary | ICD-10-CM | POA: Diagnosis not present

## 2021-06-24 DIAGNOSIS — O26893 Other specified pregnancy related conditions, third trimester: Secondary | ICD-10-CM | POA: Diagnosis present

## 2021-06-24 DIAGNOSIS — M545 Low back pain, unspecified: Secondary | ICD-10-CM | POA: Diagnosis not present

## 2021-06-24 DIAGNOSIS — R102 Pelvic and perineal pain: Secondary | ICD-10-CM | POA: Insufficient documentation

## 2021-06-24 DIAGNOSIS — M549 Dorsalgia, unspecified: Secondary | ICD-10-CM

## 2021-06-24 DIAGNOSIS — O1493 Unspecified pre-eclampsia, third trimester: Secondary | ICD-10-CM

## 2021-06-24 LAB — URINALYSIS, ROUTINE W REFLEX MICROSCOPIC
Bilirubin Urine: NEGATIVE
Glucose, UA: NEGATIVE mg/dL
Hgb urine dipstick: NEGATIVE
Ketones, ur: 20 mg/dL — AB
Nitrite: NEGATIVE
Protein, ur: 100 mg/dL — AB
Specific Gravity, Urine: 1.023 (ref 1.005–1.030)
pH: 5 (ref 5.0–8.0)

## 2021-06-24 LAB — COMPREHENSIVE METABOLIC PANEL
ALT: 15 U/L (ref 0–44)
AST: 21 U/L (ref 15–41)
Albumin: 2.8 g/dL — ABNORMAL LOW (ref 3.5–5.0)
Alkaline Phosphatase: 231 U/L — ABNORMAL HIGH (ref 38–126)
Anion gap: 12 (ref 5–15)
BUN: 8 mg/dL (ref 6–20)
CO2: 19 mmol/L — ABNORMAL LOW (ref 22–32)
Calcium: 9.3 mg/dL (ref 8.9–10.3)
Chloride: 105 mmol/L (ref 98–111)
Creatinine, Ser: 0.43 mg/dL — ABNORMAL LOW (ref 0.44–1.00)
GFR, Estimated: >60 mL/min (ref >60)
Glucose, Bld: 128 mg/dL — ABNORMAL HIGH (ref 70–99)
Potassium: 3.8 mmol/L (ref 3.5–5.1)
Sodium: 136 mmol/L (ref 135–145)
Total Bilirubin: 0.4 mg/dL (ref 0.3–1.2)
Total Protein: 6.2 g/dL — ABNORMAL LOW (ref 6.5–8.1)

## 2021-06-24 LAB — PROTEIN / CREATININE RATIO, URINE
Creatinine, Urine: 134.78 mg/dL
Protein Creatinine Ratio: 0.51 mg/mg{Cre} — ABNORMAL HIGH (ref 0.00–0.15)
Total Protein, Urine: 69 mg/dL

## 2021-06-24 LAB — CBC
HCT: 40.2 % (ref 36.0–46.0)
Hemoglobin: 14.1 g/dL (ref 12.0–15.0)
MCH: 30 pg (ref 26.0–34.0)
MCHC: 35.1 g/dL (ref 30.0–36.0)
MCV: 85.5 fL (ref 80.0–100.0)
Platelets: 212 10*3/uL (ref 150–400)
RBC: 4.7 MIL/uL (ref 3.87–5.11)
RDW: 12.6 % (ref 11.5–15.5)
WBC: 10.7 10*3/uL — ABNORMAL HIGH (ref 4.0–10.5)
nRBC: 0 % (ref 0.0–0.2)

## 2021-06-24 MED ORDER — CYCLOBENZAPRINE HCL 5 MG PO TABS
5.0000 mg | ORAL_TABLET | Freq: Once | ORAL | Status: AC
  Administered 2021-06-24: 5 mg via ORAL
  Filled 2021-06-24: qty 1

## 2021-06-24 NOTE — MAU Provider Note (Signed)
?History  ?  ? ?CSN: [id number] ? ?Arrival date and time: 06/24/21 1345 ? ?None  ?  ?Chief Complaint  ?Patient presents with  ? Back Pain  ? Pelvic Pain  ? ?HPI ?Julia Nelson is a 21 y.o. G2P1001 at [redacted]w[redacted]d who presents to MAU for low back pain and pelvic pressure. Patient reports pain started last night and progressively worsened throughout the day. She reports she took 2 extra strength Tylenol at 11am and a warm bath which did not help. Pain feels similar to her first pregnancy when she "had back labor". Back pain is constant and does not radiate and is strictly in the lower back. Has some tightness in lower abdomen. She denies urinary s/s, fever, vaginal bleeding, leaking fluid, headache, vision changes, or RUQ/epigastric pain. Endorses active fetal movement. Reports cervix was 3cm at prenatal visit this week. ? ?Patient receives prenatal care at [facility]. 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 She is scheduled for IOL on 06/27/2021.  ? ? ?OB History   ? ? Gravida  ?2  ? Para  ?1  ? Term  ?1  ? Preterm  ?0  ? AB  ?0  ? Living  ?1  ?  ? ? SAB  ?0  ? IAB  ?0  ? Ectopic  ?0  ? Multiple  ?0  ? Live Births  ?1  ?   ?  ?  ? ? ?Past Medical History:  ?Diagnosis Date  ? Gestational diabetes   ? History of pre-eclampsia   ? Hypertension 2020  ? ? ?Past Surgical History:  ?Procedure Laterality Date  ? CHOLECYSTECTOMY  2016  ? ? ?Family History  ?Problem Relation Age of Onset  ? Diabetes Mother   ?     Type II  ? ? ?Social History  ? ?Tobacco Use  ? Smoking status: Never  ? Smokeless tobacco: Never  ?Vaping Use  ? Vaping Use: Never used  ?Substance Use Topics  ? Alcohol use: No  ? Drug use: No  ? ? ?Allergies: No Known Allergies ? ?No medications prior to admission.  ? ? ?Review of Systems  ?Constitutional: Negative.   ?Respiratory: Negative.    ?Cardiovascular: Negative.   ?Gastrointestinal: Negative.   ?Genitourinary: Negative.   ?Musculoskeletal:  Positive for back pain.  ?Neurological:  Negative.   ? ?Physical Exam  ? ?Patient Vitals for the past 24 hrs: ? BP Pulse Resp SpO2  ?06/24/21 1532 113/73 (!) 123 20 99 %  ?06/24/21 1517 133/69 (!) 116 -- --  ?06/24/21 1502 132/73 (!) 126 -- --  ?06/24/21 1447 137/76 (!) 124 -- --  ?06/24/21 1424 (!) 145/102 (!) 116 -- --  ?06/24/21 1408 139/81 (!) 127 18 98 %  ? ?Physical Exam ?Vitals and nursing note reviewed.  ?Constitutional:   ?   General: She is not in acute distress. ?   Appearance: She is obese.  ?Eyes:  ?   Extraocular Movements: Extraocular movements intact.  ?   Pupils: Pupils are equal, round, and reactive to light.  ?Cardiovascular:  ?   Rate and Rhythm: Tachycardia present.  ?Pulmonary:  ?   Effort: Pulmonary effort is normal.  ?Abdominal:  ?   Palpations: Abdomen is soft.  ?   Tenderness: There is no abdominal tenderness. There is no right CVA tenderness or left CVA tenderness.  ?   Comments: Gravid ?  ?Genitourinary: ?   Comments: VE: 3/50/-3 by RN ?Musculoskeletal:     ?  General: Normal range of motion.  ?   Cervical back: Normal range of motion.  ?Skin: ?   General: Skin is warm and dry.  ?Neurological:  ?   General: No focal deficit present.  ?   Mental Status: She is alert and oriented to person, place, and time.  ?Psychiatric:     ?   Mood and Affect: Mood normal.     ?   Behavior: Behavior normal.     ?   Thought Content: Thought content normal.     ?   Judgment: Judgment normal.  ? ?NST ?FHR: 145 bpm, moderate variability, +15x15 accels, no decels ?Toco: Q 5-49mins ? ?MAU Course  ?Procedures ?NST ?UA ?CBC, CMP, UPCR ?Serial BP's ?Flexeril PO ? ?MDM ?Serial BP's normotensive to mild range. Patient asymptomatic. UPCR elevated at 0.51, however all other labs reassuring. NST reactive and reassuring. Cervix unchanged. Patient was given Flexeril PO for back pain. Patient remains asymptomatic for pre-e during MAU stay. Given lab results, patient does meet criteria for preeclampsia without severe features at this time. Reviewed with Dr.  Vergie Living who agrees with plan of care- discharge home and patient to keep IOL as scheduled. Patient was given strict labor and pre-eclampsia return precautions.  ? ?Assessment and Plan  ?[redacted] weeks gestation of pregnancy ?Back pain ?Pelvic pain ?Preeclampsia without severe features ? ?- Discharge home in stable condition ?- Strict return precautions reviewed ?- Return to MAU sooner or as needed for worsening symptoms ?- Keep IOL as scheduled  ? ? ?Brand Males, CNM ?06/24/2021, 3:56 PM  ?

## 2021-06-24 NOTE — MAU Note (Signed)
.  Julia Nelson is a 21 y.o. at [redacted]w[redacted]d here in MAU reporting: increased pelvic pressure and back pain that is sharp and goes from one side to the other. Stated that when she had this back pain a few weeks ago she was having ctx and had dilated to 3cm. Denies any vag bleeding or leaking fluid. Good fetal movement felt.  ? ?Onset of complaint: started last night got worse this morning.  ?Pain score: 8/10 ?There were no vitals filed for this visit.   ?FHT:147 ?Lab orders placed from triage:   ? ?

## 2021-06-26 ENCOUNTER — Encounter (HOSPITAL_COMMUNITY): Payer: Self-pay | Admitting: Obstetrics and Gynecology

## 2021-06-26 ENCOUNTER — Inpatient Hospital Stay (HOSPITAL_COMMUNITY): Payer: Medicaid Other | Admitting: Anesthesiology

## 2021-06-26 ENCOUNTER — Inpatient Hospital Stay (HOSPITAL_COMMUNITY)
Admission: AD | Admit: 2021-06-26 | Discharge: 2021-06-27 | DRG: 807 | Disposition: A | Discharge status: Home / Self Care | Payer: Medicaid Other | Attending: Obstetrics and Gynecology | Admitting: Obstetrics and Gynecology

## 2021-06-26 ENCOUNTER — Other Ambulatory Visit: Payer: Self-pay

## 2021-06-26 DIAGNOSIS — O24414 Gestational diabetes mellitus in pregnancy, insulin controlled: Secondary | ICD-10-CM

## 2021-06-26 DIAGNOSIS — O134 Gestational [pregnancy-induced] hypertension without significant proteinuria, complicating childbirth: Secondary | ICD-10-CM | POA: Diagnosis present

## 2021-06-26 DIAGNOSIS — O24424 Gestational diabetes mellitus in childbirth, insulin controlled: Secondary | ICD-10-CM | POA: Diagnosis present

## 2021-06-26 DIAGNOSIS — O99214 Obesity complicating childbirth: Secondary | ICD-10-CM | POA: Diagnosis present

## 2021-06-26 DIAGNOSIS — O26893 Other specified pregnancy related conditions, third trimester: Secondary | ICD-10-CM | POA: Diagnosis present

## 2021-06-26 DIAGNOSIS — Z20822 Contact with and (suspected) exposure to covid-19: Secondary | ICD-10-CM | POA: Diagnosis present

## 2021-06-26 DIAGNOSIS — Z3A37 37 weeks gestation of pregnancy: Secondary | ICD-10-CM

## 2021-06-26 LAB — CBC
HCT: 40.4 % (ref 36.0–46.0)
Hemoglobin: 13.8 g/dL (ref 12.0–15.0)
MCH: 29.4 pg (ref 26.0–34.0)
MCHC: 34.2 g/dL (ref 30.0–36.0)
MCV: 86.1 fL (ref 80.0–100.0)
Platelets: 206 10*3/uL (ref 150–400)
RBC: 4.69 MIL/uL (ref 3.87–5.11)
RDW: 12.9 % (ref 11.5–15.5)
WBC: 12.4 10*3/uL — ABNORMAL HIGH (ref 4.0–10.5)
nRBC: 0 % (ref 0.0–0.2)

## 2021-06-26 LAB — COMPREHENSIVE METABOLIC PANEL
ALT: 14 U/L (ref 0–44)
AST: 16 U/L (ref 15–41)
Albumin: 2.7 g/dL — ABNORMAL LOW (ref 3.5–5.0)
Alkaline Phosphatase: 251 U/L — ABNORMAL HIGH (ref 38–126)
Anion gap: 11 (ref 5–15)
BUN: 10 mg/dL (ref 6–20)
CO2: 19 mmol/L — ABNORMAL LOW (ref 22–32)
Calcium: 9.2 mg/dL (ref 8.9–10.3)
Chloride: 104 mmol/L (ref 98–111)
Creatinine, Ser: 0.45 mg/dL (ref 0.44–1.00)
GFR, Estimated: >60 mL/min (ref >60)
Glucose, Bld: 166 mg/dL — ABNORMAL HIGH (ref 70–99)
Potassium: 3.8 mmol/L (ref 3.5–5.1)
Sodium: 134 mmol/L — ABNORMAL LOW (ref 135–145)
Total Bilirubin: 0.5 mg/dL (ref 0.3–1.2)
Total Protein: 5.7 g/dL — ABNORMAL LOW (ref 6.5–8.1)

## 2021-06-26 LAB — PROTEIN / CREATININE RATIO, URINE
Creatinine, Urine: 70.69 mg/dL
Protein Creatinine Ratio: 0.64 mg/mg{Cre} — ABNORMAL HIGH (ref 0.00–0.15)
Total Protein, Urine: 45 mg/dL

## 2021-06-26 LAB — TYPE AND SCREEN
ABO/RH(D): A POS
Antibody Screen: NEGATIVE

## 2021-06-26 LAB — GLUCOSE, CAPILLARY: Glucose-Capillary: 151 mg/dL — ABNORMAL HIGH (ref 70–99)

## 2021-06-26 LAB — RESP PANEL BY RT-PCR (FLU A&B, COVID) ARPGX2
Influenza A by PCR: NEGATIVE
Influenza B by PCR: NEGATIVE
SARS Coronavirus 2 by RT PCR: NEGATIVE

## 2021-06-26 LAB — LACTATE DEHYDROGENASE: LDH: 121 U/L (ref 98–192)

## 2021-06-26 LAB — URIC ACID: Uric Acid, Serum: 5.2 mg/dL (ref 2.5–7.1)

## 2021-06-26 LAB — RPR: RPR Ser Ql: NONREACTIVE

## 2021-06-26 MED ORDER — SENNOSIDES-DOCUSATE SODIUM 8.6-50 MG PO TABS
2.0000 | ORAL_TABLET | Freq: Every day | ORAL | Status: DC
  Administered 2021-06-27: 2 via ORAL
  Filled 2021-06-26: qty 2

## 2021-06-26 MED ORDER — ACETAMINOPHEN 325 MG PO TABS
650.0000 mg | ORAL_TABLET | ORAL | Status: DC | PRN
  Administered 2021-06-26 – 2021-06-27 (×2): 650 mg via ORAL
  Filled 2021-06-26 (×2): qty 2

## 2021-06-26 MED ORDER — INSULIN NPH (HUMAN) (ISOPHANE) 100 UNIT/ML ~~LOC~~ SUSP
20.0000 [IU] | Freq: Every day | SUBCUTANEOUS | Status: DC
  Administered 2021-06-26: 20 [IU] via SUBCUTANEOUS
  Filled 2021-06-26: qty 10

## 2021-06-26 MED ORDER — SOD CITRATE-CITRIC ACID 500-334 MG/5ML PO SOLN: 30.0000 mL | ORAL | Status: DC | PRN

## 2021-06-26 MED ORDER — LACTATED RINGERS IV SOLN: INTRAVENOUS | Status: DC

## 2021-06-26 MED ORDER — OXYCODONE-ACETAMINOPHEN 5-325 MG PO TABS: 1.0000 | ORAL_TABLET | ORAL | Status: DC | PRN

## 2021-06-26 MED ORDER — DIPHENHYDRAMINE HCL 50 MG/ML IJ SOLN: 12.5000 mg | INTRAMUSCULAR | Status: DC | PRN

## 2021-06-26 MED ORDER — ACETAMINOPHEN 325 MG PO TABS: 650.0000 mg | ORAL_TABLET | ORAL | Status: DC | PRN

## 2021-06-26 MED ORDER — TETANUS-DIPHTH-ACELL PERTUSSIS 5-2.5-18.5 LF-MCG/0.5 IM SUSY: 0.5000 mL | PREFILLED_SYRINGE | Freq: Once | INTRAMUSCULAR | Status: DC

## 2021-06-26 MED ORDER — OXYCODONE-ACETAMINOPHEN 5-325 MG PO TABS: 2.0000 | ORAL_TABLET | ORAL | Status: DC | PRN

## 2021-06-26 MED ORDER — DIPHENHYDRAMINE HCL 25 MG PO CAPS: 25.0000 mg | ORAL_CAPSULE | Freq: Four times a day (QID) | ORAL | Status: DC | PRN

## 2021-06-26 MED ORDER — INSULIN NPH (HUMAN) (ISOPHANE) 100 UNIT/ML ~~LOC~~ SUSP: 15.0000 [IU] | Freq: Every day | SUBCUTANEOUS | Status: DC

## 2021-06-26 MED ORDER — LIDOCAINE-EPINEPHRINE (PF) 1.5 %-1:200000 IJ SOLN
INTRAMUSCULAR | Status: DC | PRN
  Administered 2021-06-26: 5 mL via EPIDURAL

## 2021-06-26 MED ORDER — FENTANYL-BUPIVACAINE-NACL 0.5-0.125-0.9 MG/250ML-% EP SOLN
12.0000 mL/h | EPIDURAL | Status: DC | PRN
  Administered 2021-06-26: 12 mL/h via EPIDURAL
  Filled 2021-06-26: qty 250

## 2021-06-26 MED ORDER — OXYCODONE HCL 5 MG PO TABS: 10.0000 mg | ORAL_TABLET | ORAL | Status: DC | PRN

## 2021-06-26 MED ORDER — LACTATED RINGERS IV SOLN: 500.0000 mL | INTRAVENOUS | Status: DC | PRN

## 2021-06-26 MED ORDER — LIDOCAINE HCL (PF) 1 % IJ SOLN: 30.0000 mL | INTRAMUSCULAR | Status: DC | PRN

## 2021-06-26 MED ORDER — IBUPROFEN 600 MG PO TABS
600.0000 mg | ORAL_TABLET | Freq: Four times a day (QID) | ORAL | Status: DC
  Administered 2021-06-26 – 2021-06-27 (×4): 600 mg via ORAL
  Filled 2021-06-26 (×5): qty 1

## 2021-06-26 MED ORDER — PHENYLEPHRINE 40 MCG/ML (10ML) SYRINGE FOR IV PUSH (FOR BLOOD PRESSURE SUPPORT): 80.0000 ug | PREFILLED_SYRINGE | INTRAVENOUS | Status: DC | PRN

## 2021-06-26 MED ORDER — ONDANSETRON HCL 4 MG PO TABS: 4.0000 mg | ORAL_TABLET | ORAL | Status: DC | PRN

## 2021-06-26 MED ORDER — EPHEDRINE 5 MG/ML INJ: 10.0000 mg | INTRAVENOUS | Status: DC | PRN

## 2021-06-26 MED ORDER — DIBUCAINE (PERIANAL) 1 % EX OINT: 1.0000 "application " | TOPICAL_OINTMENT | CUTANEOUS | Status: DC | PRN

## 2021-06-26 MED ORDER — ONDANSETRON HCL 4 MG/2ML IJ SOLN: 4.0000 mg | INTRAMUSCULAR | Status: DC | PRN

## 2021-06-26 MED ORDER — LIDOCAINE-EPINEPHRINE (PF) 1.5 %-1:200000 IJ SOLN: INTRAMUSCULAR | Status: DC | PRN

## 2021-06-26 MED ORDER — OXYTOCIN BOLUS FROM INFUSION
333.0000 mL | Freq: Once | INTRAVENOUS | Status: AC
  Administered 2021-06-26: 333 mL via INTRAVENOUS

## 2021-06-26 MED ORDER — OXYCODONE HCL 5 MG PO TABS: 5.0000 mg | ORAL_TABLET | ORAL | Status: DC | PRN

## 2021-06-26 MED ORDER — COCONUT OIL OIL: 1.0000 "application " | TOPICAL_OIL | Status: DC | PRN

## 2021-06-26 MED ORDER — BENZOCAINE-MENTHOL 20-0.5 % EX AERO
1.0000 "application " | INHALATION_SPRAY | CUTANEOUS | Status: DC | PRN
  Administered 2021-06-26: 1 via TOPICAL
  Filled 2021-06-26: qty 56

## 2021-06-26 MED ORDER — BUTORPHANOL TARTRATE 1 MG/ML IJ SOLN
1.0000 mg | INTRAMUSCULAR | Status: DC | PRN
  Administered 2021-06-26: 1 mg via INTRAVENOUS
  Filled 2021-06-26: qty 1

## 2021-06-26 MED ORDER — OXYTOCIN-SODIUM CHLORIDE 30-0.9 UT/500ML-% IV SOLN
2.5000 [IU]/h | INTRAVENOUS | Status: DC
  Administered 2021-06-26: 2.5 [IU]/h via INTRAVENOUS
  Filled 2021-06-26: qty 500

## 2021-06-26 MED ORDER — PRENATAL MULTIVITAMIN CH
1.0000 | ORAL_TABLET | Freq: Every day | ORAL | Status: DC
  Administered 2021-06-26 – 2021-06-27 (×2): 1 via ORAL
  Filled 2021-06-26 (×2): qty 1

## 2021-06-26 MED ORDER — PHENYLEPHRINE 40 MCG/ML (10ML) SYRINGE FOR IV PUSH (FOR BLOOD PRESSURE SUPPORT)
80.0000 ug | PREFILLED_SYRINGE | INTRAVENOUS | Status: DC | PRN
  Filled 2021-06-26: qty 10

## 2021-06-26 MED ORDER — ONDANSETRON HCL 4 MG/2ML IJ SOLN
4.0000 mg | Freq: Four times a day (QID) | INTRAMUSCULAR | Status: DC | PRN
  Administered 2021-06-26: 4 mg via INTRAVENOUS
  Filled 2021-06-26: qty 2

## 2021-06-26 MED ORDER — ZOLPIDEM TARTRATE 5 MG PO TABS: 5.0000 mg | ORAL_TABLET | Freq: Every evening | ORAL | Status: DC | PRN

## 2021-06-26 MED ORDER — LACTATED RINGERS IV SOLN
500.0000 mL | Freq: Once | INTRAVENOUS | Status: AC
  Administered 2021-06-26: 500 mL via INTRAVENOUS

## 2021-06-26 MED ORDER — SIMETHICONE 80 MG PO CHEW: 80.0000 mg | CHEWABLE_TABLET | ORAL | Status: DC | PRN

## 2021-06-26 MED ORDER — WITCH HAZEL-GLYCERIN EX PADS: 1.0000 "application " | MEDICATED_PAD | CUTANEOUS | Status: DC | PRN

## 2021-06-26 NOTE — Anesthesia Procedure Notes (Signed)
Epidural ?Patient location during procedure: OB ?Start time: 06/26/2021 6:32 AM ?End time: 06/26/2021 6:50 AM ? ?Staffing ?Anesthesiologist: Lewie Loron, MD ?Performed: anesthesiologist  ? ?Preanesthetic Checklist ?Completed: patient identified, IV checked, risks and benefits discussed, monitors and equipment checked, pre-op evaluation and timeout performed ? ?Epidural ?Patient position: sitting ?Prep: DuraPrep and site prepped and draped ?Patient monitoring: heart rate, continuous pulse ox and blood pressure ?Approach: midline ?Location: L4-L5 ?Injection technique: LOR air and LOR saline ? ?Needle:  ?Needle type: Tuohy  ?Needle gauge: 17 G ?Needle length: 9 cm ?Needle insertion depth: 9 cm ?Catheter type: closed end flexible ?Catheter size: 19 Gauge ?Catheter at skin depth: 15 cm ?Test dose: negative ? ?Assessment ?Sensory level: T8 ?Events: blood not aspirated, injection not painful, no injection resistance, no paresthesia and negative IV test ? ?Additional Notes ?Reason for block:procedure for pain ? ? ? ?

## 2021-06-26 NOTE — MAU Note (Signed)
Julia Nelson is a 21 y.o. at [redacted]w[redacted]d here in MAU reporting: Ctx every 3-4 min. Checked Friday and was 3cm. No LOF or bleeding +FM ?LMP:  ?Onset of complaint: 06/25/21 2130 ?Pain score: 10/10 ?Vitals:  ? 06/26/21 0356  ?BP: 135/87  ?Pulse: (!) 113  ?Resp: 20  ?Temp: 98 ?F (36.7 ?C)  ?SpO2: 97%  ?   ?FHT:137 ?Lab orders placed from triage:  ? ?

## 2021-06-26 NOTE — Progress Notes (Signed)
Patient ID: Julia Nelson, female   DOB: 2000/10/18, 21 y.o.   MRN: [id number] ?Pt reports back pain despite epidural- moderate. +Fms ?VSS ?GEN - appears comfortable  ?EFM - cat 1, 145 ?TOCO - ctxs q ?SVE - c/c/-1 ? ?A/P: Completely dilated but high station, comfortable ?        Will labor down then commence pushing ?         Anticipate svd  ?

## 2021-06-26 NOTE — H&P (Signed)
Julia Nelson is a 21 y.o.G2P1001 female presenting in active labor at 6cm dilation ( c/o contractions). She is dated per 7 week [place]. Pregnancy complications include  ?GHTN : baby asa, one elev BP in MAU. Hx preE ?GDMA2: on insulin NPH 28/25 ?LOC during pregnancy: neuro consult done; likely due to psychosocial issues  ?Maternal obesity: BMI 38 ? ?Genetic screen wnl. She is GBS neg ? ?OB History   ? ? Gravida  ?2  ? Para  ?1  ? Term  ?1  ? Preterm  ?0  ? AB  ?0  ? Living  ?1  ?  ? ? SAB  ?0  ? IAB  ?0  ? Ectopic  ?0  ? Multiple  ?0  ? Live Births  ?1  ?   ?  ?  ? ?Past Medical History:  ?Diagnosis Date  ? Gestational diabetes   ? History of pre-eclampsia   ? Hypertension 2020  ? ?Past Surgical History:  ?Procedure Laterality Date  ? CHOLECYSTECTOMY  2016  ? ?Family History: family history includes Diabetes in her mother. ?Social History:  reports that she has never smoked. She has never used smokeless tobacco. She reports that she does not drink alcohol and does not use drugs. ? ? ?  ?Maternal Diabetes: Yes:  Diabetes Type:  Insulin/Medication controlled ?Genetic Screening: Normal ?Maternal Ultrasounds/Referrals: Normal ?Fetal Ultrasounds or other Referrals:  None ?Maternal Substance Abuse:  No ?Significant Maternal Medications:  None ?Significant Maternal Lab Results:  Group B Strep negative ?Other Comments:  None ? ?Review of Systems  ?Constitutional:  Positive for activity change.  ?Eyes:  Negative for photophobia and visual disturbance.  ?Respiratory:  Negative for chest tightness and shortness of breath.   ?Cardiovascular:  Positive for leg swelling. Negative for chest pain and palpitations.  ?Gastrointestinal:  Positive for abdominal pain.  ?Genitourinary:  Positive for pelvic pain.  ?Neurological:  Negative for light-headedness and headaches.  ?Psychiatric/Behavioral:  The patient is nervous/anxious.   ?Maternal Medical History:  ?Reason for admission: Contractions.  ? ?Contractions: Onset was 3-5 hours ago.    ?Frequency: regular.   ?Perceived severity is moderate.   ?Fetal activity: Perceived fetal activity is normal.   ?Prenatal complications: PIH.   ?Prenatal Complications - Diabetes: type 2. ?Diabetes is managed by insulin injections.   ? ?  ?Blood pressure (!) 152/93, pulse (!) 111, temperature 98 ?F (36.7 ?C), temperature source Oral, resp. rate 18, height 5\' 7"  (1.702 m), weight 118.8 kg, last menstrual period 09/25/2020, SpO2 97 %, unknown if currently breastfeeding. ?Maternal Exam:  ?Uterine Assessment: Contraction strength is moderate.  Contraction frequency is regular.  ?Abdomen: Patient reports generalized tenderness.  ?Estimated fetal weight is AGA.   ?Fetal presentation: vertex ?Introitus: Normal vulva. Vulva is negative for condylomata and lesion.  ?Normal vagina.  Vagina is negative for condylomata.  ?Pelvis: adequate for delivery.   ?Cervix: Cervix evaluated by digital exam.   ? ? ?Fetal Exam ?Fetal Monitor Review: Baseline rate: cat 1.  ?Variability: moderate (6-25 bpm).   ?Pattern: accelerations present and no decelerations.   ?Fetal State Assessment: Category I - tracings are normal. ? ?Physical Exam ?Vitals and nursing note reviewed. Exam conducted with a chaperone present.  ?Constitutional:   ?   Appearance: Normal appearance.  ?Pulmonary:  ?   Effort: Pulmonary effort is normal.  ?Abdominal:  ?   Tenderness: There is generalized abdominal tenderness.  ?Genitourinary: ?   General: Normal vulva.  ?Vulva is no lesion.  ?Musculoskeletal:     ?  General: Normal range of motion.  ?   Cervical back: Normal range of motion.  ?Skin: ?   General: Skin is warm.  ?   Capillary Refill: Capillary refill takes 2 to 3 seconds.  ?Neurological:  ?   General: No focal deficit present.  ?   Mental Status: She is alert and oriented to person, place, and time. Mental status is at baseline.  ?Psychiatric:     ?   Mood and Affect: Mood normal.     ?   Behavior: Behavior normal.     ?   Thought Content: Thought content  normal.  ?  ?Prenatal labs: ?ABO, Rh: A/Positive/-- (09/15 0000) ?Antibody: Negative (09/15 0000) ?Rubella: Immune (09/15 0000) ?RPR: Nonreactive (09/15 0000)  ?HBsAg: Negative (09/15 0000)  ?HIV: Non-reactive (09/15 0000)  ?GBS: Negative/-- (02/28 0000)  ? ?Assessment/Plan: ?21yo G31P1001 female at 52 0/7wks in active labor ?- Admit ?- GBS neg ?- Pain control prn  ?- PreE labs ?- CBGs  ?- Augment with pitocin / AROM when able ?- Anticipate svd   ? ?Cathrine Muster ?06/26/2021, 5:09 AM ? ? ? ? ?

## 2021-06-26 NOTE — Anesthesia Postprocedure Evaluation (Signed)
Anesthesia Post Note ? ?Patient: Julia Nelson ? ?Procedure(s) Performed: AN AD HOC LABOR EPIDURAL ? ?  ? ?Patient location during evaluation: Mother Baby ?Anesthesia Type: Epidural ?Level of consciousness: awake ?Pain management: satisfactory to patient ?Vital Signs Assessment: post-procedure vital signs reviewed and stable ?Respiratory status: spontaneous breathing ?Cardiovascular status: stable ?Anesthetic complications: no ? ? ?No notable events documented. ? ?Last Vitals:  ?Vitals:  ? 06/26/21 1225 06/26/21 1550  ?BP: 115/72 135/73  ?Pulse: (!) 108 (!) 107  ?Resp: 18 16  ?Temp: 36.8 ?C 37.1 ?C  ?SpO2: 100% 100%  ?  ?Last Pain:  ?Vitals:  ? 06/26/21 1550  ?TempSrc: Oral  ?PainSc: 2   ? ?Pain Goal:   ? ?  ?  ?  ?  ?  ?  ?Epidural/Spinal Function Cutaneous sensation: Normal sensation (06/26/21 1550), Patient able to flex knees: Yes (06/26/21 1550), Patient able to lift hips off bed: Yes (06/26/21 1550), Back pain beyond tenderness at insertion site: No (06/26/21 1550), Progressively worsening motor and/or sensory loss: No (06/26/21 1550), Bowel and/or bladder incontinence post epidural: No (06/26/21 1550) ? ?Ashwath Lasch ? ? ? ? ?

## 2021-06-26 NOTE — Anesthesia Preprocedure Evaluation (Signed)
Anesthesia Evaluation  ?Patient identified by MRN, date of birth, ID band ?Patient awake ? ? ? ?Reviewed: ?Allergy & Precautions, H&P , Patient's Chart, lab work & pertinent test results ? ?History of Anesthesia Complications ?Negative for: history of anesthetic complications ? ?Airway ?Mallampati: II ? ?TM Distance: >3 FB ?Neck ROM: full ? ? ? Dental ?no notable dental hx. ? ?  ?Pulmonary ?neg pulmonary ROS,  ?  ?Pulmonary exam normal ? ? ? ? ? ? ? Cardiovascular ?hypertension, negative cardio ROS ?Normal cardiovascular exam ?Rhythm:regular Rate:Normal ? ? ?  ?Neuro/Psych ?negative neurological ROS ? negative psych ROS  ? GI/Hepatic ?negative GI ROS, Neg liver ROS,   ?Endo/Other  ?diabetesMorbid obesity ? Renal/GU ?negative Renal ROS  ?negative genitourinary ?  ?Musculoskeletal ? ? Abdominal ?(+) + obese,   ?Peds ? Hematology ?negative hematology ROS ?(+)   ?Anesthesia Other Findings ? ? Reproductive/Obstetrics ?(+) Pregnancy ? ?  ? ? ? ? ? ? ? ? ? ? ? ? ? ?  ?  ? ? ? ? ? ? ? ? ?Anesthesia Physical ? ?Anesthesia Plan ? ?ASA: 3 ? ?Anesthesia Plan: Epidural  ? ?Post-op Pain Management:   ? ?Induction:  ? ?PONV Risk Score and Plan:  ? ?Airway Management Planned:  ? ?Additional Equipment:  ? ?Intra-op Plan:  ? ?Post-operative Plan:  ? ?Informed Consent: I have reviewed the patients History and Physical, chart, labs and discussed the procedure including the risks, benefits and alternatives for the proposed anesthesia with the patient or authorized representative who has indicated his/her understanding and acceptance.  ? ? ? ? ? ?Plan Discussed with:  ? ?Anesthesia Plan Comments:   ? ? ? ? ? ? ?Anesthesia Quick Evaluation ? ?

## 2021-06-26 NOTE — Plan of Care (Signed)
?  Problem: Education: ?Goal: Knowledge of Childbirth will improve ?Outcome: Completed/Met ?Goal: Ability to make informed decisions regarding treatment and plan of care will improve ?Outcome: Completed/Met ?Goal: Ability to state and carry out methods to decrease the pain will improve ?Outcome: Completed/Met ?Goal: Individualized Educational Video(s) ?Outcome: Completed/Met ?  ?Problem: Coping: ?Goal: Ability to verbalize concerns and feelings about labor and delivery will improve ?Outcome: Completed/Met ?  ?Problem: Life Cycle: ?Goal: Ability to make normal progression through stages of labor will improve ?Outcome: Completed/Met ?Goal: Ability to effectively push during vaginal delivery will improve ?Outcome: Completed/Met ?  ?Problem: Role Relationship: ?Goal: Will demonstrate positive interactions with the child ?Outcome: Completed/Met ?  ?Problem: Safety: ?Goal: Risk of complications during labor and delivery will decrease ?Outcome: Completed/Met ?  ?Problem: Pain Management: ?Goal: Relief or control of pain from uterine contractions will improve ?Outcome: Completed/Met ?  ?Problem: Education: ?Goal: Knowledge of disease or condition will improve ?Outcome: Completed/Met ?Goal: Knowledge of the prescribed therapeutic regimen will improve ?Outcome: Completed/Met ?  ?Problem: Fluid Volume: ?Goal: Peripheral tissue perfusion will improve ?Outcome: Completed/Met ?  ?Problem: Clinical Measurements: ?Goal: Complications related to disease process, condition or treatment will be avoided or minimized ?Outcome: Completed/Met ?  ?

## 2021-06-26 NOTE — Lactation Note (Signed)
This note was copied from a baby's chart. ?Lactation Consultation Note ?Mom wanted to pump and bottle feed. Mom knew she would have to give formula until her milk came in. Mom asked to see Lactation because she heard putting the baby on the breast will help her milk come in sooner. LC that is correct. ?LC assisted baby to the breast and baby BF for the 1st time. Mom denied painful latch just felt weird. ?Hand expression taught noting colostrum. ?Mom has been using DEBP every three hours only collecting a few drops on the flange. ? ?When baby was crying noted thick labial frenulum to bottom of gum line and tongue has a little bit of indention on tip. ? ?Newborn feeding habits, STS, I&O, positioning, support, props, breast massage, how to make hands free bra, milk storage, building milk supply discussed. ?Mom has WIC. LC made [facility] referral for a pump for mom. ?Mom only has hand pump. ?Encourage mom to BF before giving formula. ?Encouraged to call for assistance if needed. ? ?Patient Name: Julia Nelson ?Today's Date: 06/26/2021 ?Reason for consult: Initial assessment;Primapara;Early term 37-38.6wks;Maternal endocrine disorder ?Age:74 hours ? ?Maternal Data ?Has patient been taught Hand Expression?: Yes ?Does the patient have breastfeeding experience prior to this delivery?: No ? ?Feeding ?Mother's Current Feeding Choice: Breast Milk and Formula ?Nipple Type: Slow - flow ? ?LATCH Score ?Latch: Grasps breast easily, tongue down, lips flanged, rhythmical sucking. ? ?Audible Swallowing: A few with stimulation ? ?Type of Nipple: Everted at rest and after stimulation (short shaft) ? ?Comfort (Breast/Nipple): Soft / non-tender ? ?Hold (Positioning): Full assist, staff holds infant at breast ? ?LATCH Score: 7 ? ? ?Lactation Tools Discussed/Used ?Tools: Pump ?Breast pump type: Double-Electric Breast Pump ?Pump Education: Milk Storage ?Reason for Pumping: mom wants to pump and bottle feed. ?Pumping frequency: q  3hr ? ?Interventions ?Interventions: Breast feeding basics reviewed;Assisted with latch;Skin to skin;Breast massage;Hand express;Pre-pump if needed;Adjust position;Support pillows;Position options;DEBP;LC Services brochure ? ?Discharge ?Discharge Education: Engorgement and breast care ?Pump: DEBP ?WIC Program: Yes ? ?Consult Status ?Consult Status: Follow-up ?Date: 06/27/21 ?Follow-up type: In-patient ? ? ? ?Charyl Dancer ?06/26/2021, 10:42 PM ? ? ? ?

## 2021-06-27 ENCOUNTER — Inpatient Hospital Stay (HOSPITAL_COMMUNITY): Payer: Medicaid Other

## 2021-06-27 ENCOUNTER — Encounter (HOSPITAL_COMMUNITY): Payer: Self-pay

## 2021-06-27 ENCOUNTER — Inpatient Hospital Stay (HOSPITAL_COMMUNITY)
Admission: AD | Admit: 2021-06-27 | Payer: Medicaid Other | Source: Home / Self Care | Admitting: Obstetrics and Gynecology

## 2021-06-27 LAB — CBC
HCT: 35.4 % — ABNORMAL LOW (ref 36.0–46.0)
Hemoglobin: 11.8 g/dL — ABNORMAL LOW (ref 12.0–15.0)
MCH: 29.4 pg (ref 26.0–34.0)
MCHC: 33.3 g/dL (ref 30.0–36.0)
MCV: 88.3 fL (ref 80.0–100.0)
Platelets: 179 10*3/uL (ref 150–400)
RBC: 4.01 MIL/uL (ref 3.87–5.11)
RDW: 13 % (ref 11.5–15.5)
WBC: 8.5 10*3/uL (ref 4.0–10.5)
nRBC: 0 % (ref 0.0–0.2)

## 2021-06-27 MED ORDER — IBUPROFEN 600 MG PO TABS: 600.0000 mg | ORAL_TABLET | Freq: Four times a day (QID) | ORAL | 0 refills | Status: AC | PRN

## 2021-06-27 NOTE — Lactation Note (Signed)
This note was copied from a baby's chart. ?Lactation Consultation Note ? ?Patient Name: Boy Shareta Fishbaugh ?Today's Date: 06/27/2021 ?Reason for consult: Follow-up assessment;Early term 37-38.6wks ?Age:23 hours ? ?P2, Mother states she did not fully attempt breastfeeding with first child.  Encouraged mother to hand express before and after pumping.  Suggest mother call if she would like assistance with latching.  Mother states she will has spoken to [facility] and they will provided her with DEBP.  ? ?Maternal Data ?Has patient been taught Hand Expression?: Yes ?Does the patient have breastfeeding experience prior to this delivery?: No ? ?Feeding ?Mother's Current Feeding Choice: Breast Milk and Formula ? ? ?Lactation Tools Discussed/Used ?Tools: Pump ?Breast pump type: Double-Electric Breast Pump ?Pump Education: Setup, frequency, and cleaning;Milk Storage ?Reason for Pumping: mother's choice ?Pumping frequency:  (q 3 hours) ?Pumped volume:  (drops) ? ?Interventions ?Interventions: DEBP;Education ? ?Discharge ?  ? ?Consult Status ?Consult Status: Follow-up ?Date: 06/28/21 ?Follow-up type: In-patient ? ? ? ?Dahlia Byes Boschen ?06/27/2021, 12:10 PM ? ? ? ?

## 2021-06-27 NOTE — Discharge Summary (Signed)
? ?  Postpartum Discharge Summary ? ? ?   ?Patient Name: Julia Nelson ?DOB: 2001/01/05 ?MRN: [id number] ? ?Date of admission: 06/26/2021 ?Delivery date:06/26/2021  ?Delivering provider: [facility]  ?Date of discharge: 06/27/2021 ? ?Admitting diagnosis: Labor and delivery, indication for care [O75.9] ?Intrauterine pregnancy: [redacted]w[redacted]d     ?Secondary diagnosis:  Principal Problem: ?  SVD (spontaneous vaginal delivery) ? ?Additional problems: obesity, A2 gestational diabetes    ?Discharge diagnosis: Term Pregnancy Delivered and GDM A2                                              ?Post partum procedures: NA ?Augmentation: Pitocin ?Complications: None ? ?Hospital course: Induction of Labor With Vaginal Delivery   ?21 y.o. yo G2P2002 at [redacted]w[redacted]d was admitted to the hospital 06/26/2021 for SROM/labor.   Patient had an uncomplicated labor course as follows: ?Membrane Rupture Time/Date: 8:28 AM ,06/26/2021   ?Delivery Method:Vaginal, Spontaneous  ?Episiotomy: None  ?Lacerations:  2nd degree  ?Details of delivery can be found in separate delivery note.  Patient had a routine postpartum course. Patient is discharged home 06/27/21. ? ?Newborn Data: ?Birth date:06/26/2021  ?Birth time:9:16 AM  ?Gender:Female  ?Living status:Living  ?Apgars:8 ,9  ?Weight:3160 g  ? ?Magnesium Sulfate received: No ?BMZ received: No ?Rhophylac:N/A ? ? ?Physical exam  ?Vitals:  ? 06/26/21 1550 06/26/21 2058 06/26/21 2347 06/27/21 5277  ?BP: 135/73 127/69 131/69 125/74  ?Pulse: (!) 107 (!) 118 (!) 117 (!) 111  ?Resp: 16 18 18 18   ?Temp: 98.7 ?F (37.1 ?C) 98.5 ?F (36.9 ?C) 98.4 ?F (36.9 ?C) 98.1 ?F (36.7 ?C)  ?TempSrc: Oral Oral Oral Oral  ?SpO2: 100% 98% 98% 97%  ?Weight:      ?Height:      ? ?General: alert, cooperative, and no distress ?Lochia: appropriate ?Uterine Fundus: firm ?DVT Evaluation: No evidence of DVT seen on physical exam. ?Labs: ?Lab Results  ?Component Value Date  ? WBC 8.5 06/27/2021  ? HGB 11.8 (L) 06/27/2021  ? HCT 35.4 (L)  06/27/2021  ? MCV 88.3 06/27/2021  ? PLT 179 06/27/2021  ? ?CMP Latest Ref Rng & Units 06/26/2021  ?Glucose 70 - 99 mg/dL 08/26/2021)  ?BUN 6 - 20 mg/dL 10  ?Creatinine 0.44 - 1.00 mg/dL 824(M  ?Sodium 135 - 145 mmol/L 134(L)  ?Potassium 3.5 - 5.1 mmol/L 3.8  ?Chloride 98 - 111 mmol/L 104  ?CO2 22 - 32 mmol/L 19(L)  ?Calcium 8.9 - 10.3 mg/dL 9.2  ?Total Protein 6.5 - 8.1 g/dL 3.53)  ?Total Bilirubin 0.3 - 1.2 mg/dL 0.5  ?Alkaline Phos 38 - 126 U/L 251(H)  ?AST 15 - 41 U/L 16  ?ALT 0 - 44 U/L 14  ? ?Edinburgh Score: ?Edinburgh Postnatal Depression Scale Screening Tool 06/26/2021  ?I have been able to laugh and see the funny side of things. (No Data)  ?I have looked forward with enjoyment to things. -  ?I have blamed myself unnecessarily when things went wrong. -  ?I have been anxious or worried for no good reason. -  ?I have felt scared or panicky for no good reason. -  ?Things have been getting on top of me. -  ?I have been so unhappy that I have had difficulty sleeping. -  ?I have felt sad or miserable. -  ?I have been so unhappy that I have been crying. -  ?  The thought of harming myself has occurred to me. -  ?Edinburgh Postnatal Depression Scale Total -  ? ? ? ? ?After visit meds:  ?Allergies as of 06/27/2021   ?No Known Allergies ?  ? ?  ?Medication List  ?  ? ?STOP taking these medications   ? ?acetaminophen 500 MG tablet ?Commonly known as: TYLENOL ?  ?BABY ASPIRIN PO ?  ?INSULIN ASPART [place] ?  ?prenatal vitamin w/FE, FA 29-1 MG Chew chewable tablet ?  ? ?  ? ?TAKE these medications   ? ?ibuprofen 600 MG tablet ?Commonly known as: ADVIL ?Take 1 tablet (600 mg total) by mouth every 6 (six) hours as needed. ?  ? ?  ? ?  ?  ? ? ?  ?Discharge Care Instructions  ?(From admission, onward)  ?  ? ? ?  ? ?  Start     Ordered  ? 06/27/21 0000  Discharge wound care:       ?Comments: For a cesarean delivery: ?You may wash incision with soap and water.  ?Do not soak or submerge the incision for 2 weeks. ?Keep incision dry. You may  need to keep a sanitary pad or panty liner between the incision and your clothing for comfort and to keep the incision dry. ?If you note drainage, increased pain, or increased redness of the incision, then please notify your physician.  ? 06/27/21 1152  ? 06/27/21 0000  If the dressing is still on your incision site when you go home, remove it on the third day after your surgery date. Remove dressing if it begins to fall off, or if it is dirty or damaged before the third day.       ?Comments: For a cesarean delivery  ? 06/27/21 1152  ? ?  ?  ? ?  ? ? ? ?Discharge home in stable condition ?Infant Feeding: Bottle and Breast ?Infant Disposition:home with mother ?Discharge instruction: per After Visit Summary and Postpartum booklet. ?Activity: Advance as tolerated. Pelvic rest for 6 weeks.  ?Diet: routine diet ?Anticipated Birth Control: Unsure ?Postpartum Appointment:4 weeks ?Future Appointments:No future appointments. ?Follow up Visit: ? Follow-up Information   ? ? Waynard Reeds, MD. Call in 4 week(s).   ?Specialty: Obstetrics and Gynecology ?Why: for a postpartum evaluation ?Contact information: ?[address] ?[address] ?[place] [address] ?[phone] ? ? ?  ?  ? ?  ?  ? ?  ? ? ? ?  ? ?06/27/2021 ?Waynard Reeds, MD ? ? ?

## 2021-06-28 LAB — SURGICAL PATHOLOGY

## 2021-07-06 ENCOUNTER — Telehealth (HOSPITAL_COMMUNITY): Payer: Self-pay | Admitting: *Deleted

## 2021-07-06 NOTE — Telephone Encounter (Signed)
Phone voicemail message left to return nurse call. ? ?Duffy Rhody, RN 07-06-2021 at 3:17pm ?

## 2022-12-12 IMAGING — CR DG CHEST 2V
2 series · 2 of 2 positions shown · non-contrast
Comparison: None.

CLINICAL DATA: Chest pain

EXAM:
CHEST - 2 VIEW

[chest pa]
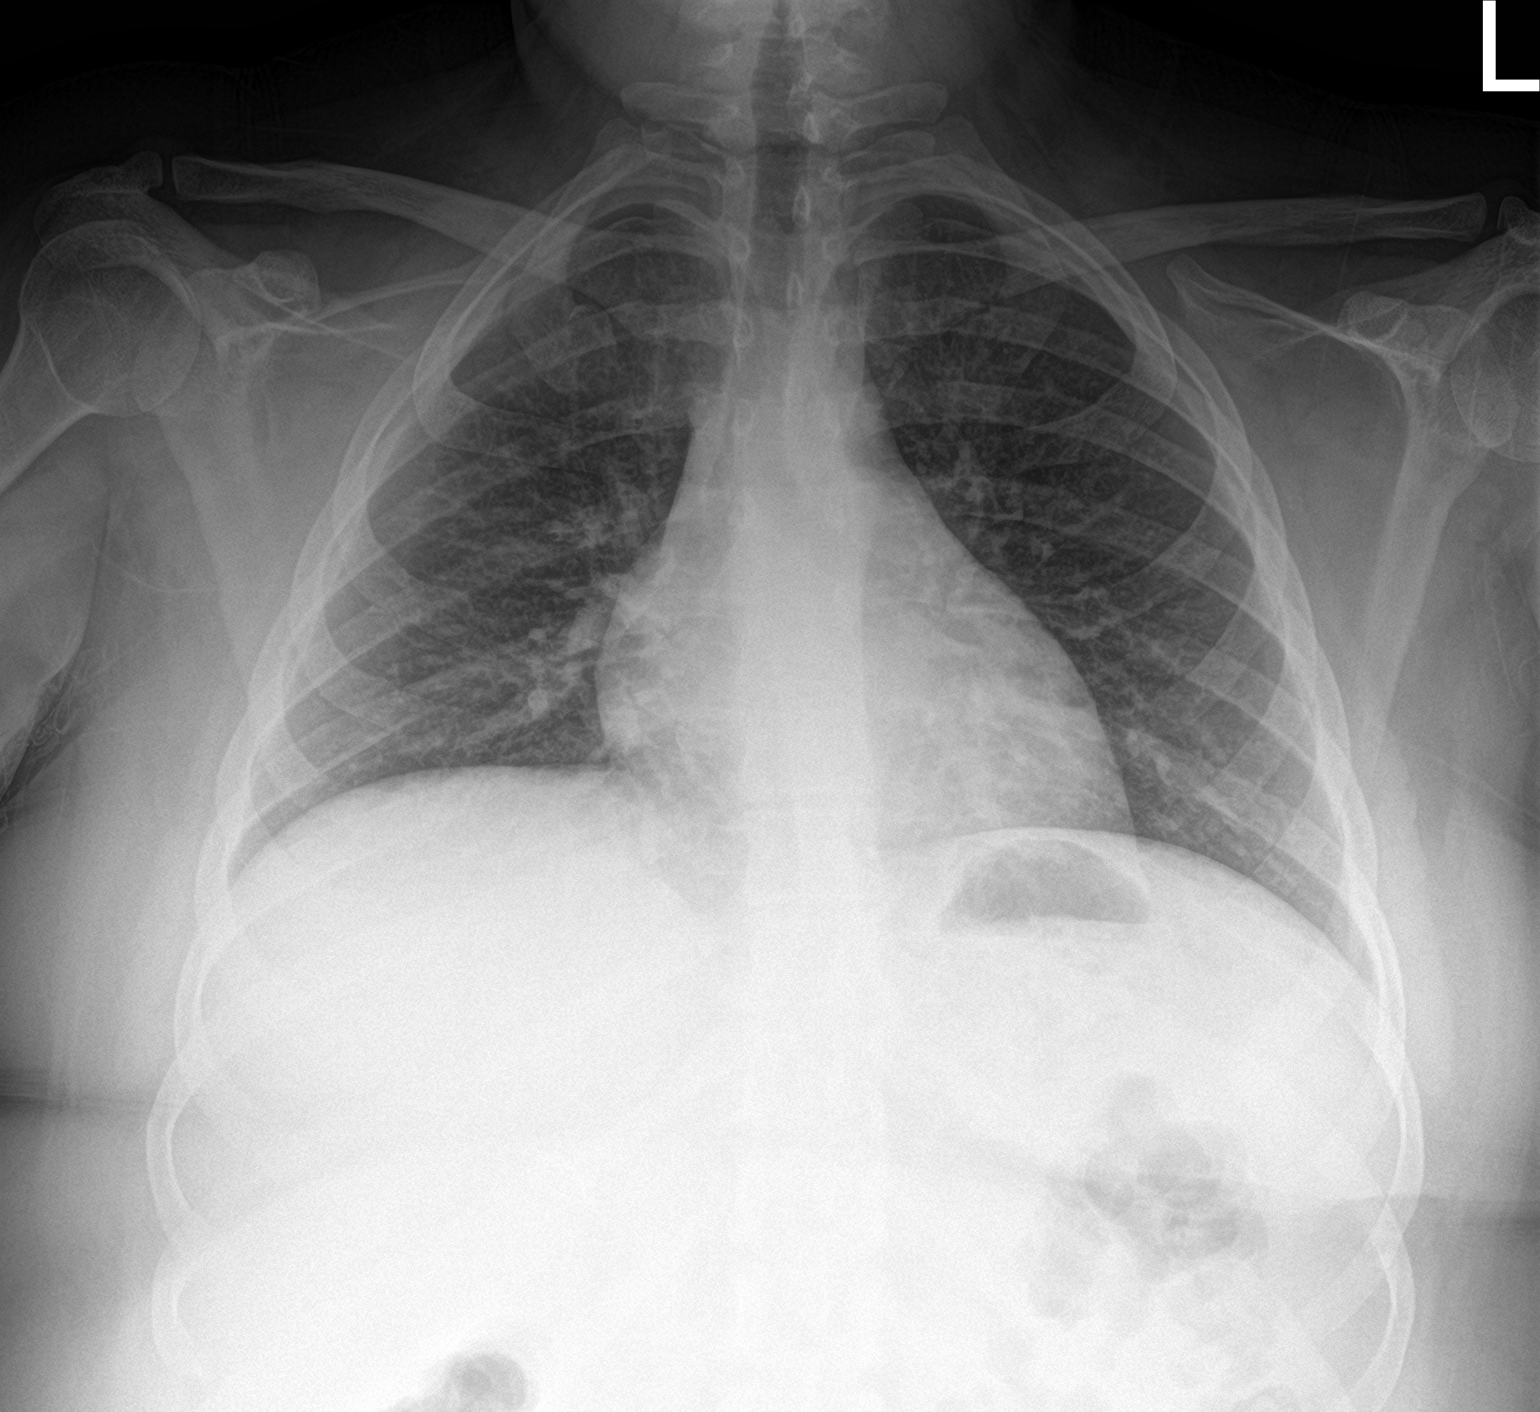

[chest lat]
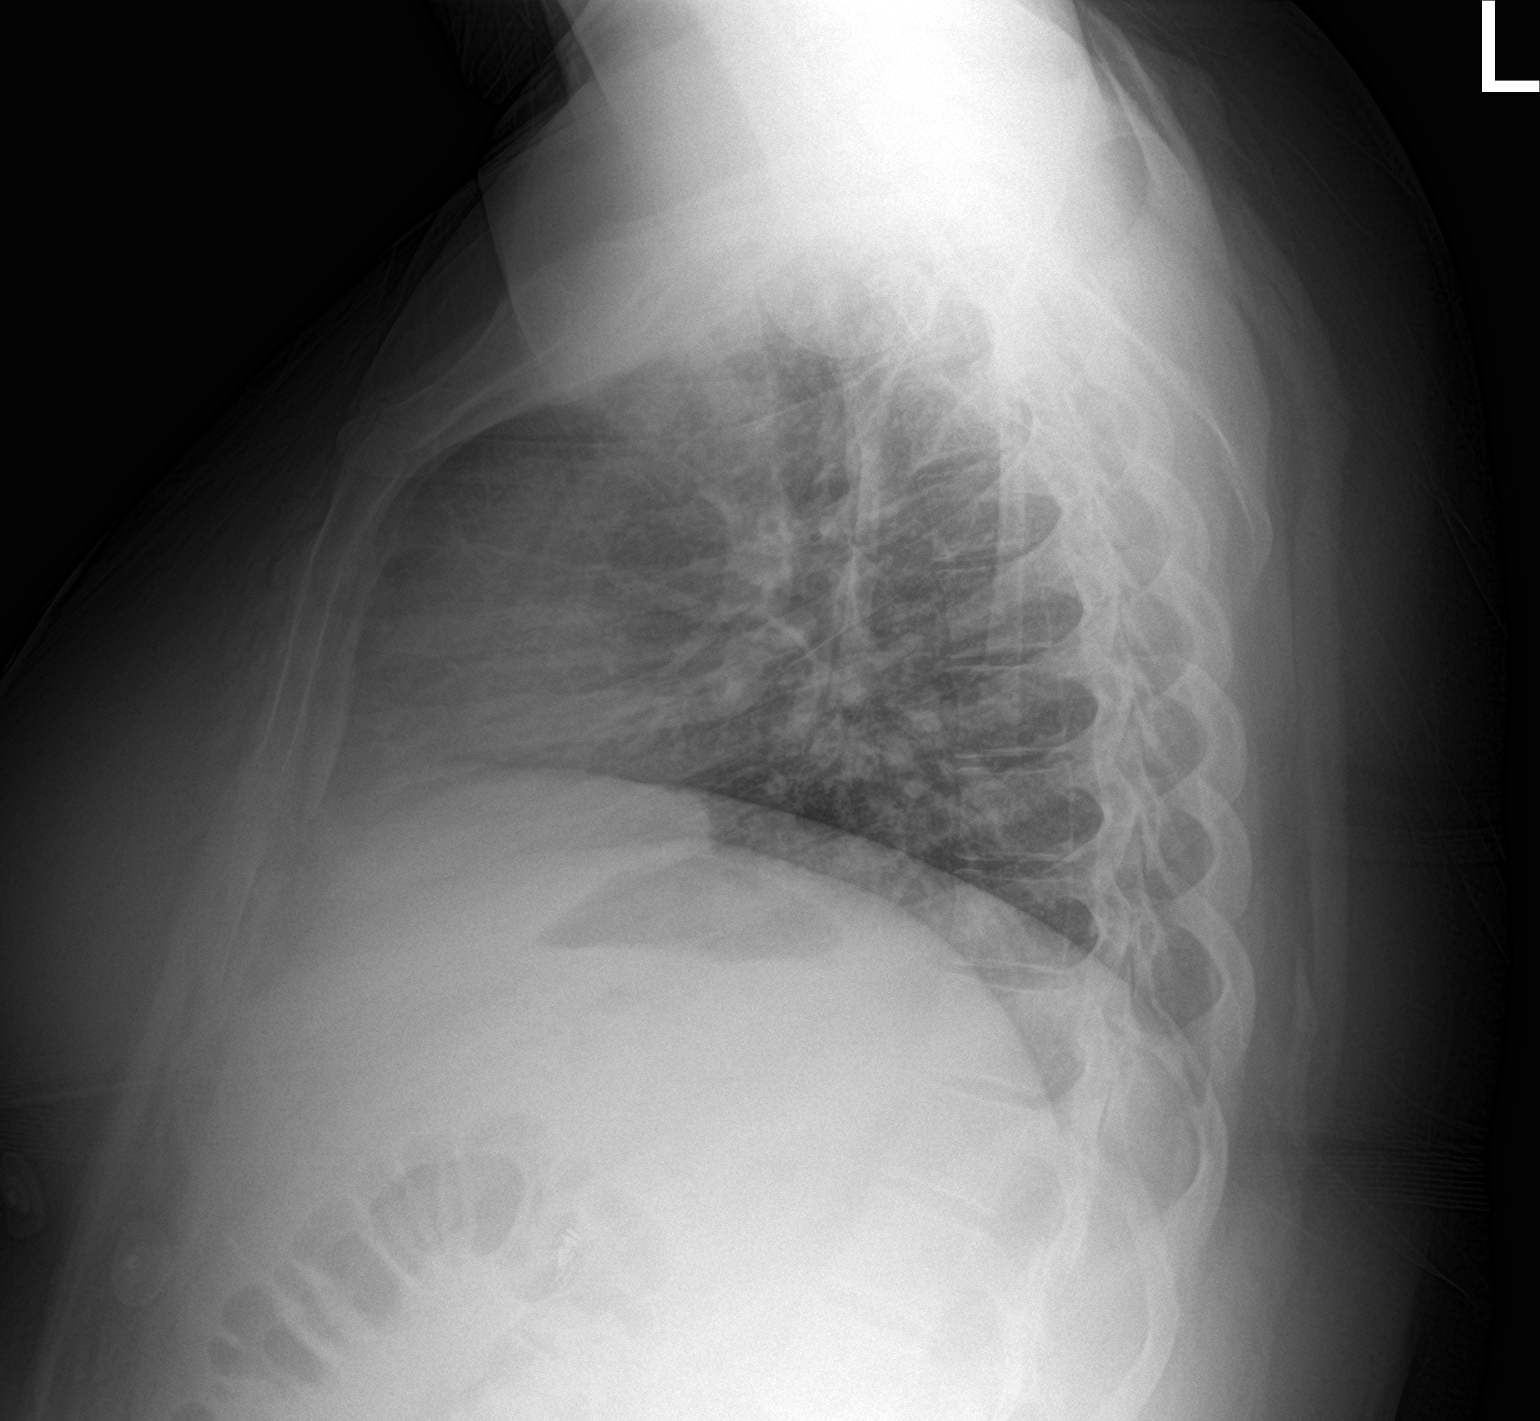

[2 of 2 positions shown; findings below may reference images not displayed]

FINDINGS: Cardiac shadow is within normal limits. Lungs are poorly aerated.
Mild peribronchial cuffing is noted which may be related to a viral
etiology. No bony abnormality is seen. No effusion is noted.
IMPRESSION: Mild increased peribronchial markings which may be related to a
viral etiology.

## 2023-04-18 ENCOUNTER — Ambulatory Visit: Payer: Self-pay
